# Patient Record
Sex: Female | Born: 1950
Health system: Southern US, Community
[De-identification: ages and names within clinical notes are randomized; demographics above are authoritative.]

## PROBLEM LIST (undated history)

## (undated) DIAGNOSIS — E785 Hyperlipidemia, unspecified: Secondary | ICD-10-CM

## (undated) DIAGNOSIS — E039 Hypothyroidism, unspecified: Secondary | ICD-10-CM

## (undated) DIAGNOSIS — M1712 Unilateral primary osteoarthritis, left knee: Secondary | ICD-10-CM

## (undated) DIAGNOSIS — I10 Essential (primary) hypertension: Secondary | ICD-10-CM

## (undated) DIAGNOSIS — D649 Anemia, unspecified: Secondary | ICD-10-CM

## (undated) DIAGNOSIS — R0600 Dyspnea, unspecified: Secondary | ICD-10-CM

## (undated) DIAGNOSIS — I499 Cardiac arrhythmia, unspecified: Secondary | ICD-10-CM

## (undated) DIAGNOSIS — R112 Nausea with vomiting, unspecified: Secondary | ICD-10-CM

## (undated) DIAGNOSIS — J45909 Unspecified asthma, uncomplicated: Secondary | ICD-10-CM

## (undated) DIAGNOSIS — Z87891 Personal history of nicotine dependence: Secondary | ICD-10-CM

## (undated) HISTORY — DX: Essential (primary) hypertension: I10

## (undated) HISTORY — DX: Hyperlipidemia, unspecified: E78.5

## (undated) HISTORY — DX: Hypothyroidism, unspecified: E03.9

## (undated) HISTORY — PX: OTHER SURGICAL HISTORY: SHX169

## (undated) HISTORY — PX: PARTIAL HYSTERECTOMY: SHX80

---

## 2009-09-11 ENCOUNTER — Ambulatory Visit: Payer: Self-pay | Admitting: General Practice

## 2009-09-16 ENCOUNTER — Ambulatory Visit: Payer: Self-pay | Admitting: General Practice

## 2012-08-01 ENCOUNTER — Ambulatory Visit: Payer: Self-pay | Admitting: General Practice

## 2012-08-01 LAB — CBC
HGB: 13.2 g/dL (ref 12.0–16.0)
MCH: 28.9 pg (ref 26.0–34.0)
MCHC: 34.3 g/dL (ref 32.0–36.0)
MCV: 84 fL (ref 80–100)
Platelet: 231 10*3/uL (ref 150–440)
RBC: 4.56 10*6/uL (ref 3.80–5.20)
RDW: 12.8 % (ref 11.5–14.5)
WBC: 7.7 10*3/uL (ref 3.6–11.0)

## 2012-08-01 LAB — BASIC METABOLIC PANEL
Anion Gap: 7 (ref 7–16)
BUN: 17 mg/dL (ref 7–18)
Calcium, Total: 9.6 mg/dL (ref 8.5–10.1)
Chloride: 107 mmol/L (ref 98–107)
EGFR (African American): >60
Osmolality: 285 (ref 275–301)

## 2012-08-01 LAB — URINALYSIS, COMPLETE
Bilirubin,UR: NEGATIVE
Glucose,UR: NEGATIVE mg/dL (ref 0–75)
Ph: 5 (ref 4.5–8.0)
Protein: NEGATIVE
RBC,UR: 1 /HPF (ref 0–5)
Squamous Epithelial: 3
WBC UR: 2 /HPF (ref 0–5)

## 2012-08-01 LAB — SEDIMENTATION RATE: Erythrocyte Sed Rate: 19 mm/hr (ref 0–30)

## 2012-08-01 LAB — MRSA PCR SCREENING

## 2012-08-17 ENCOUNTER — Inpatient Hospital Stay: Payer: Self-pay | Admitting: General Practice

## 2012-08-18 LAB — BASIC METABOLIC PANEL
Anion Gap: 7 (ref 7–16)
BUN: 9 mg/dL (ref 7–18)
Chloride: 108 mmol/L — ABNORMAL HIGH (ref 98–107)
Co2: 26 mmol/L (ref 21–32)
Creatinine: 0.73 mg/dL (ref 0.60–1.30)
EGFR (Non-African Amer.): >60
Glucose: 127 mg/dL — ABNORMAL HIGH (ref 65–99)
Osmolality: 282 (ref 275–301)
Potassium: 3.9 mmol/L (ref 3.5–5.1)

## 2012-08-18 LAB — PLATELET COUNT: Platelet: 194 10*3/uL (ref 150–440)

## 2012-08-18 LAB — HEMOGLOBIN: HGB: 10.7 g/dL — ABNORMAL LOW (ref 12.0–16.0)

## 2012-08-19 LAB — BASIC METABOLIC PANEL
Anion Gap: 5 — ABNORMAL LOW (ref 7–16)
BUN: 5 mg/dL — ABNORMAL LOW (ref 7–18)
Chloride: 106 mmol/L (ref 98–107)
Creatinine: 0.85 mg/dL (ref 0.60–1.30)
EGFR (Non-African Amer.): >60
Osmolality: 279 (ref 275–301)
Potassium: 3.3 mmol/L — ABNORMAL LOW (ref 3.5–5.1)
Sodium: 141 mmol/L (ref 136–145)

## 2012-08-19 LAB — HEMOGLOBIN: HGB: 10 g/dL — ABNORMAL LOW (ref 12.0–16.0)

## 2012-08-20 LAB — BASIC METABOLIC PANEL
Anion Gap: 4 — ABNORMAL LOW (ref 7–16)
BUN: 6 mg/dL — ABNORMAL LOW (ref 7–18)
Calcium, Total: 8.4 mg/dL — ABNORMAL LOW (ref 8.5–10.1)
Chloride: 106 mmol/L (ref 98–107)
EGFR (Non-African Amer.): >60
Osmolality: 280 (ref 275–301)
Potassium: 3.4 mmol/L — ABNORMAL LOW (ref 3.5–5.1)

## 2014-06-08 NOTE — Discharge Summary (Signed)
PATIENT NAME:  Jacqueline Herrera, Jacqueline Herrera MR#:  [id number] DATE OF BIRTH:  09-20-1950  DATE OF ADMISSION:  08/17/2012 DATE OF DISCHARGE:  08/20/2012   DICTATING FOR: Jeneen Rinks P. Holley Bouche., MD  ADMITTING DIAGNOSIS: Degenerative arthrosis of the right hip.   DISCHARGE DIAGNOSIS: Degenerative arthrosis of the right hip.   HISTORY: The patient is a pleasant 64 year old female who has been followed at [facility] for progression of right hip and groin pain. The pain was noted to be aggravated with weight-bearing activities. She has also been reporting some night pain. She states that she had had occasional radiation of her pain from the groin down to the level of the thigh in the last couple weeks prior to surgery as well as having some in the right buttocks radiating all the way down the leg to the ankle. She describes it as a pressure sensation. The patient had stated that nothing seemed to be giving her any relief from this discomfort. The patient had denied any knee swelling, locking or giving way. The patient had appreciated some decrease in her range of motion of the hip. At the time of surgery, she was not using any ambulatory aid. She had not seen any significant improvement in her condition despite intra-articular cortisone injections as well as mild activity modification. She has also been trying to take meloxicam, which did not really give her a lot of relief. The patient states that the pain increased to the point that it was significantly interfering with her activities of daily living. X-rays taken in [facility] showed bone-on-bone to the superior aspect of the femoral acetabulum. Subchondral sclerosis was noted. After discussion of the risks and benefits of surgical intervention, the patient expressed her understanding of the risks and benefits and agreed for plans for surgical intervention.   PROCEDURE: Right total hip arthroplasty.   ANESTHESIA: Spinal.   IMPLANTS UTILIZED: DePuy  10.5 mm small stature AML femoral component, a 48 mm outer diameter Pinnacle Gription Sector acetabular component, a +4 mm 10 degree Pinnacle Marathon polyethylene liner, and a 32 mm Cobalt chrome hip ball with a +1 mm neck length.   HOSPITAL COURSE: The patient tolerated the procedure very well. She had no complications. She was then taken to the PACU, where she was stabilized and then transferred to the orthopedic floor. The patient began receiving anticoagulation therapy of Lovenox 30 mg subcutaneous q.12 hours per anesthesia and pharmacy protocol. She was fitted with TED stockings bilaterally. These were allowed to be removed 1 hour per 8-hour shift. The patient was also fitted with the AVI compression foot pumps bilaterally set at 80 mmHg. Her calves have been nontender. There has been no evidence of any DVTs to the lower extremity. Negative Homans sign. Heels were elevated off the bed using rolled towels. Knees were flexed with pillows under the knees.   The patient has denied any chest pains or shortness of breath. Vital signs have been stable. She has been afebrile. Hemodynamically, she was stable. No transfusions were given. The patient was noted to be extremely nauseated the first couple days. This was felt to be secondary to the narcotics, and this was changed to Nucynta, which she seemed to tolerate very well. She actually had gone to taking just Tylenol on the last day of admission. Her potassium was slightly low, and this was supplemented with potassium, Klor-Con. This was felt to be secondary to the nausea that she was experiencing.   Physical therapy was initiated on day  1 for gait training and transfers. She has done very well. Upon being discharged, was ambulating greater than 200 feet. Was able to go up and down 4 sets of steps. Was independent with bed-to-chair transfers. Occupational therapy was also initiated on day 1 for ADLs and assistive devices. She has progressed very nicely. No  complications.   The patient's IV, Foley and Hemovac were discontinued on day 2 along with the dressing change. The wound was free of any drainage or any signs of infection. No neurologic deficit was noted to the lower extremity. Had good gross motor strength.   DISPOSITION: The patient is being discharged to home in improved stable condition.   DISCHARGE INSTRUCTIONS:  1. She may weight bear as tolerated. Continue using a walker until cleared by physical therapy to go to a quad cane.  2. She is to wear her TED stockings during the day, but may be removed at night.  3. She was instructed on posterior hip precautions once again.  4. She is to resume her regular diet.  5. Dressing will be changed as needed. Jodell Cipro will be removed on July 16th, followed by the application of benzoin and Steri-Strips.  6. She has a followup appointment on August 12th at 9:00 a.m. She is to call the clinic sooner if any bleeding or increased temperatures of 101.5 or greater.   DISCHARGE MEDICATIONS: She is to resume her regular medication that she was on prior to admission except for meloxicam. She may start this after finishing Lovenox. She was given a prescription for Nucynta 50 mg 1 to 2 tablets q.4-6 hours p.r.n. for pain and Lovenox 40 mg daily for 14 days, then discontinue and begin taking her 81 mg enteric-coated aspirin.   PAST MEDICAL HISTORY:  1. Seasonal allergies.  2. Thyroid disease.  3. Psoriasis.  4. Hypertension.  5. Hypercholesterolemia.   ____________________________ Vance Peper, PA [id number] D: 08/20/2012 07:42:46 ET T: 08/20/2012 08:29:08 ET JOB#: [id number]  cc: Vance Peper, PA, <Dictator> Brenna Friesenhahn PA ELECTRONICALLY SIGNED 08/21/2012 8:26

## 2014-06-08 NOTE — Op Note (Signed)
PATIENT NAME:  Jacqueline Herrera, Jacqueline Herrera MR#:  [id number] DATE OF BIRTH:  [birth year]  DATE OF PROCEDURE:  08/17/2012  PREOPERATIVE DIAGNOSIS: Degenerative arthrosis of the right hip.   POSTOPERATIVE DIAGNOSIS: Degenerative arthrosis of the right hip.   PROCEDURE PERFORMED: Right total hip arthroplasty.   SURGEON: Laurice Record. Holley Bouche., MD   ASSISTANT: Vance Peper, PA (required to maintain retraction throughout the procedure)   ANESTHESIA: Spinal.   ESTIMATED BLOOD LOSS: 300 mL.   FLUIDS REPLACED: 2000 mL of crystalloid.   DRAINS: Two medium drains to Hemovac reservoir.   IMPLANTS UTILIZED: DePuy 10.5 mm small stature AML femoral component, a 48 mm outer diameter Pinnacle Gription Sector acetabular component, a +4 mm, 10-degree Pinnacle Marathon polyethylene liner, and a 32 mm cobalt chrome hip ball with a +1 mm neck length.   INDICATIONS FOR SURGERY: The patient is a 64 year old female who has been seen for complaints of progressive right hip and groin pain. X-rays demonstrated significant degenerative changes. After discussion of the risks and benefits of surgical intervention, the patient expressed understanding of the risks and benefits and agreed with plans for surgical intervention.   PROCEDURE IN DETAIL: The patient was brought into the Operating Room, and after adequate spinal anesthesia was achieved, the patient was placed in a left lateral decubitus position. An axillary roll was placed, and all bony prominences were well padded. The patient's right hip and leg were cleaned and prepped with alcohol and DuraPrep and draped in the usual sterile fashion. A "timeout" was performed as per usual protocol. A lateral curvilinear incision was made gently curving towards the posterior superior iliac spine. IT band was incised in line with the skin incision, and the fibers of the gluteus maximus were split in line. The piriformis tendon was identified, skeletonized, and incised at its insertion at the  proximal femur and reflected posteriorly. In a similar fashion, short external rotators were incised and reflected posteriorly. A T-type posterior capsulotomy was performed. Prior to dislocation of the femoral head, a threaded Steinmann pin was inserted through a separate stab incision into the pelvis superior to the acetabulum and then bent in the form of a stylus so as to assess limb length and hip offset throughout the procedure. The femoral head was then dislocated posteriorly. Severe degenerative changes were noted most notably to the superior aspect. The femoral neck cut was performed using an oscillating saw. The anterior capsule was elevated off of the femoral neck. Inspection of the acetabulum also demonstrated significant degenerative change. Remnant of the labrum was excised. The acetabulum was reamed in a sequential fashion up to a 47 mm diameter. A good punctate bleeding bone was encountered. A 48 mm outer diameter Pinnacle Gription Sector acetabular component was positioned and impacted into place. Excellent scratch fit was appreciated. A +4 neutral polyethylene trial was inserted, and attention was directed to the proximal femur. A pilot hole for reaming of the proximal femoral canal was created using a high-speed bur. The proximal femoral canal was then reamed in a sequential fashion up to a 10 mm diameter. This allowed for more than 7 cm of scratch fit. The proximal portion of the femur was then prepared using a 10.5 mm aggressive side-biting reamer. A 10.5 mm broach was inserted and calcar region was planed. Trial reduction was performed using a 32 mm hip ball with a +1 mm neck segment. A good reproduction of hip offset was appreciated and good equalization of limb lengths was noted. Excellent stability was noted  anteriorly. Good stability was noted posteriorly. It was elected to trial with the +4 mm, 10-degree polyethylene trial with the high side at approximately 8 o'clock position. This allowed  for improved posterior stability. Trial components were removed. The acetabular shell was irrigated with copious amounts of normal saline with antibiotic solution and then suctioned dry. A +4 mm 10-degree Pinnacle Marathon polyethylene liner was positioned and impacted into place with the high side at 8 o'clock position.  Next, a 10.5 mm AML small stature femoral component was positioned. It was felt that an excessive amount of scratch fit would be achieved, and it was elected to ream line to line using a 10.5 mm reamer. The 10.5 mm small stature AML femoral component was positioned and impacted into place. Excellent scratch fit was appreciated. A 32 mm cobalt chrome hip ball with a +1 mm neck segment was placed on the trunnion and impacted into place. The hip was reduced and placed through range of motion. Good restoration of hip offset and equalization of limb lengths was noted. Excellent stability was noted both anteriorly and posteriorly.   The wound was irrigated with copious amounts of normal saline with antibiotic solution using pulsatile lavage and suctioned dry. Good hemostasis was appreciated. The T-type posterior capsulotomy was repaired using #5 Ethibond. The piriformis tendon was reapproximated on the undersurface of the gluteus medius tendon using #5 Ethibond. Two medium drains were placed in the wound bed and brought out through a separate stab incision. The gluteal sling was repaired using a #5 Ethibond. The IT band was repaired using interrupted sutures of #1 Vicryl. The subcutaneous tissue was approximated in layers using first #0 Vicryl, followed 2-0 Vicryl. The skin was closed with skin staples. A sterile dressing was applied.   The patient tolerated the procedure well. She was transported to the recovery room in stable condition.    ____________________________ Laurice Record. Holley Bouche., MD jph:cb D: 08/17/2012 15:46:28 ET T: 08/17/2012 20:07:45 ET JOB#: [id number]  cc: Laurice Record. Holley Bouche.,  MD, <Dictator> JAMES P Holley Bouche MD ELECTRONICALLY SIGNED 08/19/2012 18:00

## 2014-07-12 DIAGNOSIS — Z96641 Presence of right artificial hip joint: Secondary | ICD-10-CM | POA: Insufficient documentation

## 2014-12-11 ENCOUNTER — Other Ambulatory Visit: Payer: Self-pay

## 2014-12-11 DIAGNOSIS — Z1231 Encounter for screening mammogram for malignant neoplasm of breast: Secondary | ICD-10-CM

## 2015-02-19 ENCOUNTER — Ambulatory Visit
Admission: RE | Admit: 2015-02-19 | Discharge: 2015-02-19 | Disposition: A | Discharge status: Home / Self Care | Payer: PPO | Source: Ambulatory Visit

## 2015-02-19 DIAGNOSIS — Z1231 Encounter for screening mammogram for malignant neoplasm of breast: Secondary | ICD-10-CM

## 2015-06-03 DIAGNOSIS — I10 Essential (primary) hypertension: Secondary | ICD-10-CM | POA: Diagnosis not present

## 2015-06-03 DIAGNOSIS — E782 Mixed hyperlipidemia: Secondary | ICD-10-CM | POA: Diagnosis not present

## 2015-06-03 DIAGNOSIS — E669 Obesity, unspecified: Secondary | ICD-10-CM | POA: Diagnosis not present

## 2015-06-03 DIAGNOSIS — Z6836 Body mass index (BMI) 36.0-36.9, adult: Secondary | ICD-10-CM | POA: Diagnosis not present

## 2015-06-03 DIAGNOSIS — R7303 Prediabetes: Secondary | ICD-10-CM | POA: Diagnosis not present

## 2015-06-03 DIAGNOSIS — E039 Hypothyroidism, unspecified: Secondary | ICD-10-CM | POA: Diagnosis not present

## 2015-06-03 DIAGNOSIS — E559 Vitamin D deficiency, unspecified: Secondary | ICD-10-CM | POA: Diagnosis not present

## 2015-06-03 DIAGNOSIS — L918 Other hypertrophic disorders of the skin: Secondary | ICD-10-CM | POA: Diagnosis not present

## 2015-06-24 DIAGNOSIS — H2513 Age-related nuclear cataract, bilateral: Secondary | ICD-10-CM | POA: Diagnosis not present

## 2015-06-24 DIAGNOSIS — H401130 Primary open-angle glaucoma, bilateral, stage unspecified: Secondary | ICD-10-CM | POA: Diagnosis not present

## 2015-12-19 DIAGNOSIS — Z1389 Encounter for screening for other disorder: Secondary | ICD-10-CM | POA: Diagnosis not present

## 2015-12-19 DIAGNOSIS — E782 Mixed hyperlipidemia: Secondary | ICD-10-CM | POA: Diagnosis not present

## 2015-12-19 DIAGNOSIS — E039 Hypothyroidism, unspecified: Secondary | ICD-10-CM | POA: Diagnosis not present

## 2015-12-19 DIAGNOSIS — Z9181 History of falling: Secondary | ICD-10-CM | POA: Diagnosis not present

## 2015-12-19 DIAGNOSIS — I10 Essential (primary) hypertension: Secondary | ICD-10-CM | POA: Diagnosis not present

## 2016-01-15 ENCOUNTER — Other Ambulatory Visit: Payer: Self-pay | Admitting: Family Medicine

## 2016-01-15 DIAGNOSIS — Z1231 Encounter for screening mammogram for malignant neoplasm of breast: Secondary | ICD-10-CM

## 2016-02-28 ENCOUNTER — Ambulatory Visit
Admission: RE | Admit: 2016-02-28 | Discharge: 2016-02-28 | Disposition: A | Discharge status: Home / Self Care | Payer: PPO | Source: Ambulatory Visit | Attending: Family Medicine | Admitting: Family Medicine

## 2016-02-28 DIAGNOSIS — Z1231 Encounter for screening mammogram for malignant neoplasm of breast: Secondary | ICD-10-CM

## 2016-06-22 DIAGNOSIS — I10 Essential (primary) hypertension: Secondary | ICD-10-CM | POA: Diagnosis not present

## 2016-06-22 DIAGNOSIS — E559 Vitamin D deficiency, unspecified: Secondary | ICD-10-CM | POA: Diagnosis not present

## 2016-06-22 DIAGNOSIS — E669 Obesity, unspecified: Secondary | ICD-10-CM | POA: Diagnosis not present

## 2016-06-22 DIAGNOSIS — E782 Mixed hyperlipidemia: Secondary | ICD-10-CM | POA: Diagnosis not present

## 2016-06-22 DIAGNOSIS — Z6835 Body mass index (BMI) 35.0-35.9, adult: Secondary | ICD-10-CM | POA: Diagnosis not present

## 2016-06-22 DIAGNOSIS — E039 Hypothyroidism, unspecified: Secondary | ICD-10-CM | POA: Diagnosis not present

## 2016-06-23 DIAGNOSIS — H2513 Age-related nuclear cataract, bilateral: Secondary | ICD-10-CM | POA: Diagnosis not present

## 2016-06-23 DIAGNOSIS — H401130 Primary open-angle glaucoma, bilateral, stage unspecified: Secondary | ICD-10-CM | POA: Diagnosis not present

## 2016-09-23 DIAGNOSIS — Z6836 Body mass index (BMI) 36.0-36.9, adult: Secondary | ICD-10-CM | POA: Diagnosis not present

## 2016-09-23 DIAGNOSIS — N959 Unspecified menopausal and perimenopausal disorder: Secondary | ICD-10-CM | POA: Diagnosis not present

## 2016-09-23 DIAGNOSIS — Z9181 History of falling: Secondary | ICD-10-CM | POA: Diagnosis not present

## 2016-09-23 DIAGNOSIS — Z1389 Encounter for screening for other disorder: Secondary | ICD-10-CM | POA: Diagnosis not present

## 2016-09-23 DIAGNOSIS — Z136 Encounter for screening for cardiovascular disorders: Secondary | ICD-10-CM | POA: Diagnosis not present

## 2016-09-23 DIAGNOSIS — E785 Hyperlipidemia, unspecified: Secondary | ICD-10-CM | POA: Diagnosis not present

## 2016-09-23 DIAGNOSIS — Z0000 Encounter for general adult medical examination without abnormal findings: Secondary | ICD-10-CM | POA: Diagnosis not present

## 2016-10-08 DIAGNOSIS — N959 Unspecified menopausal and perimenopausal disorder: Secondary | ICD-10-CM | POA: Diagnosis not present

## 2016-10-08 DIAGNOSIS — Z13820 Encounter for screening for osteoporosis: Secondary | ICD-10-CM | POA: Diagnosis not present

## 2016-12-30 DIAGNOSIS — Z23 Encounter for immunization: Secondary | ICD-10-CM | POA: Diagnosis not present

## 2016-12-30 DIAGNOSIS — I10 Essential (primary) hypertension: Secondary | ICD-10-CM | POA: Diagnosis not present

## 2016-12-30 DIAGNOSIS — E559 Vitamin D deficiency, unspecified: Secondary | ICD-10-CM | POA: Diagnosis not present

## 2016-12-30 DIAGNOSIS — E669 Obesity, unspecified: Secondary | ICD-10-CM | POA: Diagnosis not present

## 2016-12-30 DIAGNOSIS — Z6836 Body mass index (BMI) 36.0-36.9, adult: Secondary | ICD-10-CM | POA: Diagnosis not present

## 2016-12-30 DIAGNOSIS — E782 Mixed hyperlipidemia: Secondary | ICD-10-CM | POA: Diagnosis not present

## 2016-12-30 DIAGNOSIS — E039 Hypothyroidism, unspecified: Secondary | ICD-10-CM | POA: Diagnosis not present

## 2017-01-18 ENCOUNTER — Other Ambulatory Visit: Payer: Self-pay | Admitting: Nurse Practitioner

## 2017-01-18 DIAGNOSIS — Z1231 Encounter for screening mammogram for malignant neoplasm of breast: Secondary | ICD-10-CM

## 2017-02-10 DIAGNOSIS — M545 Low back pain: Secondary | ICD-10-CM | POA: Diagnosis not present

## 2017-02-10 DIAGNOSIS — S335XXA Sprain of ligaments of lumbar spine, initial encounter: Secondary | ICD-10-CM | POA: Diagnosis not present

## 2017-03-01 ENCOUNTER — Ambulatory Visit
Admission: RE | Admit: 2017-03-01 | Discharge: 2017-03-01 | Disposition: A | Discharge status: Home / Self Care | Payer: PPO | Source: Ambulatory Visit | Attending: Nurse Practitioner | Admitting: Nurse Practitioner

## 2017-03-01 DIAGNOSIS — Z1231 Encounter for screening mammogram for malignant neoplasm of breast: Secondary | ICD-10-CM | POA: Diagnosis not present

## 2017-03-15 DIAGNOSIS — J0100 Acute maxillary sinusitis, unspecified: Secondary | ICD-10-CM | POA: Diagnosis not present

## 2017-03-28 DIAGNOSIS — H6990 Unspecified Eustachian tube disorder, unspecified ear: Secondary | ICD-10-CM | POA: Diagnosis not present

## 2017-04-08 DIAGNOSIS — H90A22 Sensorineural hearing loss, unilateral, left ear, with restricted hearing on the contralateral side: Secondary | ICD-10-CM | POA: Diagnosis not present

## 2017-05-04 DIAGNOSIS — M654 Radial styloid tenosynovitis [de Quervain]: Secondary | ICD-10-CM | POA: Diagnosis not present

## 2017-05-04 DIAGNOSIS — Z6836 Body mass index (BMI) 36.0-36.9, adult: Secondary | ICD-10-CM | POA: Diagnosis not present

## 2017-05-04 DIAGNOSIS — Z1331 Encounter for screening for depression: Secondary | ICD-10-CM | POA: Diagnosis not present

## 2017-05-04 DIAGNOSIS — L989 Disorder of the skin and subcutaneous tissue, unspecified: Secondary | ICD-10-CM | POA: Diagnosis not present

## 2017-05-04 DIAGNOSIS — E782 Mixed hyperlipidemia: Secondary | ICD-10-CM | POA: Diagnosis not present

## 2017-05-04 DIAGNOSIS — L980 Pyogenic granuloma: Secondary | ICD-10-CM | POA: Diagnosis not present

## 2017-06-23 DIAGNOSIS — H2513 Age-related nuclear cataract, bilateral: Secondary | ICD-10-CM | POA: Diagnosis not present

## 2017-06-23 DIAGNOSIS — H401131 Primary open-angle glaucoma, bilateral, mild stage: Secondary | ICD-10-CM | POA: Diagnosis not present

## 2017-07-20 DIAGNOSIS — I10 Essential (primary) hypertension: Secondary | ICD-10-CM | POA: Diagnosis not present

## 2017-07-20 DIAGNOSIS — Z6836 Body mass index (BMI) 36.0-36.9, adult: Secondary | ICD-10-CM | POA: Diagnosis not present

## 2017-07-20 DIAGNOSIS — E782 Mixed hyperlipidemia: Secondary | ICD-10-CM | POA: Diagnosis not present

## 2017-07-20 DIAGNOSIS — E039 Hypothyroidism, unspecified: Secondary | ICD-10-CM | POA: Diagnosis not present

## 2017-07-20 DIAGNOSIS — R7303 Prediabetes: Secondary | ICD-10-CM | POA: Diagnosis not present

## 2017-07-20 DIAGNOSIS — M654 Radial styloid tenosynovitis [de Quervain]: Secondary | ICD-10-CM | POA: Diagnosis not present

## 2017-08-06 DIAGNOSIS — H25013 Cortical age-related cataract, bilateral: Secondary | ICD-10-CM | POA: Diagnosis not present

## 2017-08-06 DIAGNOSIS — H2511 Age-related nuclear cataract, right eye: Secondary | ICD-10-CM | POA: Diagnosis not present

## 2017-08-06 DIAGNOSIS — H4321 Crystalline deposits in vitreous body, right eye: Secondary | ICD-10-CM | POA: Diagnosis not present

## 2017-08-06 DIAGNOSIS — H2513 Age-related nuclear cataract, bilateral: Secondary | ICD-10-CM | POA: Diagnosis not present

## 2017-08-06 DIAGNOSIS — H40013 Open angle with borderline findings, low risk, bilateral: Secondary | ICD-10-CM | POA: Diagnosis not present

## 2017-08-17 DIAGNOSIS — H2511 Age-related nuclear cataract, right eye: Secondary | ICD-10-CM | POA: Diagnosis not present

## 2017-08-17 DIAGNOSIS — H25811 Combined forms of age-related cataract, right eye: Secondary | ICD-10-CM | POA: Diagnosis not present

## 2017-08-23 DIAGNOSIS — H2511 Age-related nuclear cataract, right eye: Secondary | ICD-10-CM | POA: Diagnosis not present

## 2017-08-30 DIAGNOSIS — H2512 Age-related nuclear cataract, left eye: Secondary | ICD-10-CM | POA: Diagnosis not present

## 2017-08-30 DIAGNOSIS — H25012 Cortical age-related cataract, left eye: Secondary | ICD-10-CM | POA: Diagnosis not present

## 2017-09-07 DIAGNOSIS — H2512 Age-related nuclear cataract, left eye: Secondary | ICD-10-CM | POA: Diagnosis not present

## 2017-09-07 DIAGNOSIS — H25812 Combined forms of age-related cataract, left eye: Secondary | ICD-10-CM | POA: Diagnosis not present

## 2017-09-15 DIAGNOSIS — H2511 Age-related nuclear cataract, right eye: Secondary | ICD-10-CM | POA: Diagnosis not present

## 2017-12-28 DIAGNOSIS — H401131 Primary open-angle glaucoma, bilateral, mild stage: Secondary | ICD-10-CM | POA: Diagnosis not present

## 2018-01-24 ENCOUNTER — Other Ambulatory Visit: Payer: Self-pay | Admitting: Family Medicine

## 2018-01-24 DIAGNOSIS — Z1231 Encounter for screening mammogram for malignant neoplasm of breast: Secondary | ICD-10-CM

## 2018-01-25 DIAGNOSIS — I10 Essential (primary) hypertension: Secondary | ICD-10-CM | POA: Diagnosis not present

## 2018-01-25 DIAGNOSIS — E782 Mixed hyperlipidemia: Secondary | ICD-10-CM | POA: Diagnosis not present

## 2018-01-25 DIAGNOSIS — E039 Hypothyroidism, unspecified: Secondary | ICD-10-CM | POA: Diagnosis not present

## 2018-01-25 DIAGNOSIS — R7303 Prediabetes: Secondary | ICD-10-CM | POA: Diagnosis not present

## 2018-01-25 DIAGNOSIS — Z139 Encounter for screening, unspecified: Secondary | ICD-10-CM | POA: Diagnosis not present

## 2018-01-25 DIAGNOSIS — Z6836 Body mass index (BMI) 36.0-36.9, adult: Secondary | ICD-10-CM | POA: Diagnosis not present

## 2018-01-25 DIAGNOSIS — Z9181 History of falling: Secondary | ICD-10-CM | POA: Diagnosis not present

## 2018-03-02 ENCOUNTER — Ambulatory Visit
Admission: RE | Admit: 2018-03-02 | Discharge: 2018-03-02 | Disposition: A | Discharge status: Home / Self Care | Payer: PPO | Source: Ambulatory Visit | Attending: Family Medicine | Admitting: Family Medicine

## 2018-03-02 DIAGNOSIS — Z1231 Encounter for screening mammogram for malignant neoplasm of breast: Secondary | ICD-10-CM | POA: Diagnosis not present

## 2018-04-28 DIAGNOSIS — E782 Mixed hyperlipidemia: Secondary | ICD-10-CM | POA: Diagnosis not present

## 2018-04-28 DIAGNOSIS — I10 Essential (primary) hypertension: Secondary | ICD-10-CM | POA: Diagnosis not present

## 2018-04-28 DIAGNOSIS — Z6836 Body mass index (BMI) 36.0-36.9, adult: Secondary | ICD-10-CM | POA: Diagnosis not present

## 2018-08-11 DIAGNOSIS — Z6836 Body mass index (BMI) 36.0-36.9, adult: Secondary | ICD-10-CM | POA: Diagnosis not present

## 2018-08-11 DIAGNOSIS — R252 Cramp and spasm: Secondary | ICD-10-CM | POA: Diagnosis not present

## 2018-08-11 DIAGNOSIS — E782 Mixed hyperlipidemia: Secondary | ICD-10-CM | POA: Diagnosis not present

## 2018-08-11 DIAGNOSIS — I10 Essential (primary) hypertension: Secondary | ICD-10-CM | POA: Diagnosis not present

## 2018-08-11 DIAGNOSIS — E039 Hypothyroidism, unspecified: Secondary | ICD-10-CM | POA: Diagnosis not present

## 2018-08-11 DIAGNOSIS — Z1331 Encounter for screening for depression: Secondary | ICD-10-CM | POA: Diagnosis not present

## 2018-08-11 DIAGNOSIS — R7303 Prediabetes: Secondary | ICD-10-CM | POA: Diagnosis not present

## 2018-11-29 DIAGNOSIS — L821 Other seborrheic keratosis: Secondary | ICD-10-CM | POA: Diagnosis not present

## 2018-11-29 DIAGNOSIS — Z6836 Body mass index (BMI) 36.0-36.9, adult: Secondary | ICD-10-CM | POA: Diagnosis not present

## 2018-12-06 DIAGNOSIS — L608 Other nail disorders: Secondary | ICD-10-CM | POA: Diagnosis not present

## 2018-12-06 DIAGNOSIS — Z6836 Body mass index (BMI) 36.0-36.9, adult: Secondary | ICD-10-CM | POA: Diagnosis not present

## 2018-12-28 DIAGNOSIS — H401131 Primary open-angle glaucoma, bilateral, mild stage: Secondary | ICD-10-CM | POA: Diagnosis not present

## 2018-12-28 DIAGNOSIS — H26493 Other secondary cataract, bilateral: Secondary | ICD-10-CM | POA: Diagnosis not present

## 2018-12-28 DIAGNOSIS — H1045 Other chronic allergic conjunctivitis: Secondary | ICD-10-CM | POA: Diagnosis not present

## 2019-01-03 DIAGNOSIS — L400 Psoriasis vulgaris: Secondary | ICD-10-CM | POA: Diagnosis not present

## 2019-01-03 DIAGNOSIS — L609 Nail disorder, unspecified: Secondary | ICD-10-CM | POA: Diagnosis not present

## 2019-01-23 ENCOUNTER — Other Ambulatory Visit: Payer: Self-pay | Admitting: Nurse Practitioner

## 2019-01-23 ENCOUNTER — Other Ambulatory Visit: Payer: Self-pay | Admitting: Family Medicine

## 2019-01-23 DIAGNOSIS — Z1231 Encounter for screening mammogram for malignant neoplasm of breast: Secondary | ICD-10-CM

## 2019-02-13 DIAGNOSIS — R252 Cramp and spasm: Secondary | ICD-10-CM | POA: Diagnosis not present

## 2019-02-13 DIAGNOSIS — R7303 Prediabetes: Secondary | ICD-10-CM | POA: Diagnosis not present

## 2019-02-13 DIAGNOSIS — Z2821 Immunization not carried out because of patient refusal: Secondary | ICD-10-CM | POA: Diagnosis not present

## 2019-02-13 DIAGNOSIS — Z6837 Body mass index (BMI) 37.0-37.9, adult: Secondary | ICD-10-CM | POA: Diagnosis not present

## 2019-02-13 DIAGNOSIS — E669 Obesity, unspecified: Secondary | ICD-10-CM | POA: Diagnosis not present

## 2019-02-13 DIAGNOSIS — I10 Essential (primary) hypertension: Secondary | ICD-10-CM | POA: Diagnosis not present

## 2019-02-13 DIAGNOSIS — E039 Hypothyroidism, unspecified: Secondary | ICD-10-CM | POA: Diagnosis not present

## 2019-02-13 DIAGNOSIS — E782 Mixed hyperlipidemia: Secondary | ICD-10-CM | POA: Diagnosis not present

## 2019-02-13 DIAGNOSIS — Z9181 History of falling: Secondary | ICD-10-CM | POA: Diagnosis not present

## 2019-03-09 ENCOUNTER — Ambulatory Visit: Payer: PPO

## 2019-03-24 DIAGNOSIS — Z6837 Body mass index (BMI) 37.0-37.9, adult: Secondary | ICD-10-CM | POA: Diagnosis not present

## 2019-03-24 DIAGNOSIS — R319 Hematuria, unspecified: Secondary | ICD-10-CM | POA: Diagnosis not present

## 2019-04-25 ENCOUNTER — Other Ambulatory Visit: Payer: Self-pay

## 2019-04-25 ENCOUNTER — Ambulatory Visit
Admission: RE | Admit: 2019-04-25 | Discharge: 2019-04-25 | Disposition: A | Discharge status: Home / Self Care | Payer: PPO | Source: Ambulatory Visit | Attending: Nurse Practitioner | Admitting: Nurse Practitioner

## 2019-04-25 DIAGNOSIS — Z1231 Encounter for screening mammogram for malignant neoplasm of breast: Secondary | ICD-10-CM

## 2019-05-02 NOTE — Progress Notes (Signed)
05/03/19 7:20 PM   Jacqueline Herrera 02-23-1950 FY:[id number]  Referring provider: Philmore Pali, NP [address] [place],  [place] [address]  Chief Complaint  Patient presents with  . Hematuria    HPI: Jacqueline Herrera is a 69 yo white F who presents today for the evaluation and management of hematuria. She was referred to [place] by Daiva Eves, MD.   Today, she reports of a handful of blood after wiping with toilet tissue on 03/23/19. The following morning she reports of blood on her towel after taking a shower. She doesn't know if it's from her vagina. She is asymptomatic currently.   She visited her PCP on 03/24/19 where she complained of gross hematuria the night before PCP visit. She had UA done which indicated trace RBC and nitrite negative.   She had a remote hx of hematuria in the past and an associated negative hematuria workup.  She takes baby aspirin.   Former smoker, 1 ppd since 2007 high risk category.   She denies past hx of hematuria, flank pain, and kidney stones. She has no FHx of kidney cancer or bladder cancer.   She has a SHx of cervicectomy and hysterectomy.   Does occasionally have some blood per vagina associated with yeast infections but was not symptomatic of this at the time of the aforementioned episode.  The degree of blood was also significantly different.  PMH: Past Medical History:  Diagnosis Date  . Hyperlipidemia   . Hypertension   . Hypothyroid     Surgical History: Past Surgical History:  Procedure Laterality Date  . PARTIAL HYSTERECTOMY    . right hip replacement      Home Medications:  Allergies as of 05/03/2019      Reactions   Codeine Nausea And Vomiting   Tramadol Nausea And Vomiting   Nausea.      Medication List       Accurate as of May 03, 2019  7:20 PM. If you have any questions, ask your nurse or doctor.        aspirin 81 MG EC tablet Take by mouth.   clobetasol cream 0.05 % Commonly known as: TEMOVATE APPLY A SMALL  AMOUNT TO FEET AS DIRECTED 1 2 TIMES DAILY AS NEEDED AVOID F/G/A   Co Q 10 100 MG Caps   enalapril 5 MG tablet Commonly known as: VASOTEC Take 5 mg by mouth daily.   ergocalciferol 1.25 MG (50000 UT) capsule Commonly known as: VITAMIN D2 Take by mouth.   Fish Oil 1200 MG Caps Take by mouth.   hydrochlorothiazide 25 MG tablet Commonly known as: HYDRODIURIL Take by mouth.   Jublia 10 % Soln Generic drug: Efinaconazole BRUSH ON TO THE AFFECTED NAIL AS DIRECTED EVERY DAY AT BEDTIME   levothyroxine 112 MCG tablet Commonly known as: SYNTHROID Take by mouth.   lovastatin 20 MG tablet Commonly known as: MEVACOR Take by mouth.   Resource 2.0 Liqd Take by mouth.   rosuvastatin 10 MG tablet Commonly known as: CRESTOR Take 10 mg by mouth at bedtime.   Vitamin D3 50 MCG (2000 UT) Chew       Allergies:  Allergies  Allergen Reactions  . Codeine Nausea And Vomiting  . Tramadol Nausea And Vomiting    Nausea.     Family History: Family History  Problem Relation Age of Onset  . Breast cancer Sister 50    Social History:  has no history on file for tobacco, alcohol, and  drug.   Physical Exam: BP (!) 168/83   Pulse 97   Ht 5\' 3"  (1.6 m)   Wt 195 lb (88.5 kg)   BMI 34.54 kg/m   Constitutional:  Alert and oriented, No acute distress. HEENT: Tool AT, moist mucus membranes.  Trachea midline, no masses. Cardiovascular: No clubbing, cyanosis, or edema. Respiratory: Normal respiratory effort, no increased work of breathing. Skin: No rashes, bruises or suspicious lesions. Neurologic: Grossly intact, no focal deficits, moving all 4 extremities. Psychiatric: Normal mood and affect.  Laboratory Data: n/a  Urinalysis UA 6-10 WBC, no RBC, nitrite negative.  Assessment & Plan:    1. Gross Hematuria  Based on the nature of her bleeding, it still unclear whether or not this is related to her bladder and/or urethra however given her history of hysterectomy, is less likely  vaginal.  Plan to treat this like gross hematuria and evaluate as below.  We discussed the differential diagnosis for hematuria hematuria including nephrolithiasis, renal or upper tract tumors, bladder stones, UTIs, or bladder tumors as well as undetermined etiologies. Per AUA guidelines, I did recommend complete microscopic hematuria evaluation including CTU, possible urine cytology, and office cystoscopy.  She's in a high risk category given she's a female, >65, and history of smoking    Return in about 4 weeks (around 05/31/2019) for Cysto/ CT urogram.  [facility] [address], [place], [place] [address] [phone]  I, Lucas Mallow, am acting as a scribe for Dr. Hollice Espy,  I have reviewed the above documentation for accuracy and completeness, and I agree with the above.   Hollice Espy, MD

## 2019-05-03 ENCOUNTER — Encounter: Payer: Self-pay | Admitting: Urology

## 2019-05-03 ENCOUNTER — Other Ambulatory Visit: Payer: Self-pay

## 2019-05-03 ENCOUNTER — Ambulatory Visit: Payer: PPO | Admitting: Urology

## 2019-05-03 VITALS — BP 168/83 | HR 97 | Ht 63.0 in | Wt 195.0 lb

## 2019-05-03 DIAGNOSIS — R319 Hematuria, unspecified: Secondary | ICD-10-CM | POA: Diagnosis not present

## 2019-05-03 NOTE — Patient Instructions (Signed)

## 2019-05-04 LAB — URINALYSIS, COMPLETE
Bilirubin, UA: NEGATIVE
Glucose, UA: NEGATIVE
Ketones, UA: NEGATIVE
Nitrite, UA: NEGATIVE
Protein,UA: NEGATIVE
Specific Gravity, UA: 1.01 (ref 1.005–1.030)
Urobilinogen, Ur: 0.2 mg/dL (ref 0.2–1.0)
pH, UA: 5 (ref 5.0–7.5)

## 2019-05-04 LAB — MICROSCOPIC EXAMINATION: Bacteria, UA: NONE SEEN

## 2019-06-02 ENCOUNTER — Ambulatory Visit
Admission: RE | Admit: 2019-06-02 | Discharge: 2019-06-02 | Disposition: A | Discharge status: Home / Self Care | Payer: PPO | Source: Ambulatory Visit | Attending: Urology | Admitting: Urology

## 2019-06-02 ENCOUNTER — Other Ambulatory Visit: Payer: Self-pay

## 2019-06-02 DIAGNOSIS — R310 Gross hematuria: Secondary | ICD-10-CM | POA: Diagnosis not present

## 2019-06-02 DIAGNOSIS — R319 Hematuria, unspecified: Secondary | ICD-10-CM | POA: Diagnosis not present

## 2019-06-02 LAB — POCT I-STAT CREATININE: Creatinine, Ser: 0.8 mg/dL (ref 0.44–1.00)

## 2019-06-02 MED ORDER — IOHEXOL 300 MG/ML  SOLN
125.0000 mL | Freq: Once | INTRAMUSCULAR | Status: AC | PRN
  Administered 2019-06-02: 125 mL via INTRAVENOUS

## 2019-06-06 NOTE — Progress Notes (Signed)
06/07/19  CC:  Chief Complaint  Patient presents with  . Hematuria    HPI: Jacqueline Herrera is a 69 y.o. F who returns today for a cysto for the evaluation and management of hematuria.   On 05/03/19, she reported of a handful of blood after wiping with toilet tissue on 03/23/19. The following morning she reports of blood on her towel after taking a shower. She doesn't know if it's from her vagina. She was asymptomatic at time of visit.   She visited her PCP on 03/24/19 where she complained of gross hematuria the night before PCP visit. She had UA done which indicated trace RBC and nitrite negative.   She had a remote hx of hematuria in the past and an associated negative hematuria workup.  CT from 06/02/19 unremarkable for cause of hematuria however small right lower lobe subpleural nodule along the major fissure noted.   Denies recent episodes of gross hematuria.   Former smoker, 1 ppd since 2007 high risk category.   She has a SHx of cervicectomy and hysterectomy.   Does occasionally have some blood per vagina associated with yeast infections but was not symptomatic of this at the time of the aforementioned episode.  The degree of blood was also significantly different.  Blood pressure (!) 163/82, pulse 90, height 5\' 3"  (1.6 m), weight 195 lb (88.5 kg).. NED. A&Ox3.   No respiratory distress   Abd soft, NT, ND Normal external genitalia with patent urethral meatus  Cystoscopy Procedure Note  Patient identification was confirmed, informed consent was obtained, and patient was prepped using Betadine solution.  Lidocaine jelly was administered per urethral meatus.    Procedure: - Flexible cystoscope introduced, without any difficulty.   - Thorough search of the bladder revealed:    normal urethral meatus    normal urothelium    no stones    no ulcers     no tumors    no urethral polyps    no trabeculation  - Ureteral orifices were normal in position and  appearance.  Post-Procedure: - Patient tolerated the procedure well  Pertinent Imagings:  CLINICAL DATA:  Painless gross hematuria starting 2 months ago  EXAM: CT ABDOMEN AND PELVIS WITHOUT AND WITH CONTRAST  TECHNIQUE: Multidetector CT imaging of the abdomen and pelvis was performed following the standard protocol before and following the bolus administration of intravenous contrast.  CONTRAST:  136mL OMNIPAQUE IOHEXOL 300 MG/ML  SOLN  COMPARISON:  None.  FINDINGS: Lower chest: 9 by 4 by 4 mm (volume = 80 mm^3) right lower lobe subpleural nodule along the major fissure on image 3/20, probably a subpleural lymph node although technically nonspecific.  Hepatobiliary: Unremarkable  Pancreas: Unremarkable  Spleen: 1.0 by 0.9 cm hypodense lesion of the spleen posteriorly, technically nonspecific although highly likely to be a benign lesion such as a splenic cyst or lymphangioma.  Adrenals/Urinary Tract: Both adrenal glands appear normal. 5 by 4 mm hypodense lesion of the left mid kidney no urinary tract calculi are identified. No abnormal renal parenchymal enhancement characteristics. No abnormal filling defect or abnormal enhancement along the urothelium. Sensitivity slightly reduced in assessing the urinary bladder due to streak artifact from the patient's right hip implant on the delayed images, and metal artifact reduction algorithms performed on the precontrast and portal venous phase images.  Stomach/Bowel: There several scattered sigmoid colon diverticula. Otherwise unremarkable.  Vascular/Lymphatic: Aortoiliac atherosclerotic vascular disease. No adenopathy.  Reproductive: Uterus absent. Adnexa unremarkable.  Other: No supplemental non-categorized findings.  Musculoskeletal: Right total hip prosthesis. Degenerative disc disease with loss of disc height at L5-S1.  IMPRESSION: 1. A cause for hematuria is not identified. 2. Small right lower  lobe subpleural nodule along the major fissure, likely a subpleural lymph node although technically nonspecific. No follow-up needed if patient is low-risk. Non-contrast chest CT can be considered in 12 months if patient is high-risk. This recommendation follows the consensus statement: Guidelines for Management of Incidental Pulmonary Nodules Detected on CT Images: From the Fleischner Society 2017; Radiology 2017; 284:228-243. 3. Other imaging findings of potential clinical significance: Small hypodense lesion of the spleen posteriorly, likely a benign lesion such as a splenic cyst or lymphangioma. Degenerative disc disease with loss of disc height at L5-S1.  Aortic Atherosclerosis (ICD10-I70.0).   Electronically Signed   By: Van Clines M.D.   On: 06/02/2019 14:06  I have personally reviewed the images and agree with radiologist interpretation.   Assessment/ Plan:  1. Gross hematuria  CT from 06/02/19 unremarkable  NED on cysto  Return in 1 year w/ PA-C for UA   2. Pulmonary nodule  Recent CT indicated small right lower lobe subpleural nodule along the major fissure Ordered f/u Chest CT given hx of smoking    Return in about 1 year (around 06/06/2020) for Recheck with PA-UA .  [name], am acting as a scribe for Dr. Hollice Espy,  I have reviewed the above documentation for accuracy and completeness, and I agree with the above.   Hollice Espy, MD

## 2019-06-07 ENCOUNTER — Other Ambulatory Visit: Payer: Self-pay

## 2019-06-07 ENCOUNTER — Ambulatory Visit: Payer: PPO | Admitting: Urology

## 2019-06-07 ENCOUNTER — Encounter: Payer: Self-pay | Admitting: Urology

## 2019-06-07 VITALS — BP 163/82 | HR 90 | Ht 63.0 in | Wt 195.0 lb

## 2019-06-07 DIAGNOSIS — R918 Other nonspecific abnormal finding of lung field: Secondary | ICD-10-CM

## 2019-06-07 DIAGNOSIS — R319 Hematuria, unspecified: Secondary | ICD-10-CM | POA: Diagnosis not present

## 2019-06-07 DIAGNOSIS — R911 Solitary pulmonary nodule: Secondary | ICD-10-CM

## 2019-06-07 DIAGNOSIS — Z9189 Other specified personal risk factors, not elsewhere classified: Secondary | ICD-10-CM

## 2019-06-08 LAB — URINALYSIS, COMPLETE
Bilirubin, UA: NEGATIVE
Glucose, UA: NEGATIVE
Ketones, UA: NEGATIVE
Nitrite, UA: NEGATIVE
Protein,UA: NEGATIVE
Specific Gravity, UA: 1.015 (ref 1.005–1.030)
Urobilinogen, Ur: 0.2 mg/dL (ref 0.2–1.0)
pH, UA: 5.5 (ref 5.0–7.5)

## 2019-06-08 LAB — MICROSCOPIC EXAMINATION: Epithelial Cells (non renal): >10 /hpf — AB (ref 0–10)

## 2019-08-14 DIAGNOSIS — I10 Essential (primary) hypertension: Secondary | ICD-10-CM | POA: Diagnosis not present

## 2019-08-14 DIAGNOSIS — R319 Hematuria, unspecified: Secondary | ICD-10-CM | POA: Diagnosis not present

## 2019-08-14 DIAGNOSIS — E039 Hypothyroidism, unspecified: Secondary | ICD-10-CM | POA: Diagnosis not present

## 2019-08-14 DIAGNOSIS — G720 Drug-induced myopathy: Secondary | ICD-10-CM | POA: Diagnosis not present

## 2019-08-14 DIAGNOSIS — E669 Obesity, unspecified: Secondary | ICD-10-CM | POA: Diagnosis not present

## 2019-08-14 DIAGNOSIS — I700 Atherosclerosis of aorta: Secondary | ICD-10-CM | POA: Diagnosis not present

## 2019-08-14 DIAGNOSIS — Z139 Encounter for screening, unspecified: Secondary | ICD-10-CM | POA: Diagnosis not present

## 2019-08-14 DIAGNOSIS — E559 Vitamin D deficiency, unspecified: Secondary | ICD-10-CM | POA: Diagnosis not present

## 2019-08-14 DIAGNOSIS — R911 Solitary pulmonary nodule: Secondary | ICD-10-CM | POA: Diagnosis not present

## 2019-08-14 DIAGNOSIS — E782 Mixed hyperlipidemia: Secondary | ICD-10-CM | POA: Diagnosis not present

## 2019-08-14 DIAGNOSIS — Z6836 Body mass index (BMI) 36.0-36.9, adult: Secondary | ICD-10-CM | POA: Diagnosis not present

## 2019-08-14 DIAGNOSIS — R7303 Prediabetes: Secondary | ICD-10-CM | POA: Diagnosis not present

## 2019-09-26 DIAGNOSIS — E039 Hypothyroidism, unspecified: Secondary | ICD-10-CM | POA: Diagnosis not present

## 2020-01-03 DIAGNOSIS — H1045 Other chronic allergic conjunctivitis: Secondary | ICD-10-CM | POA: Diagnosis not present

## 2020-01-03 DIAGNOSIS — H40013 Open angle with borderline findings, low risk, bilateral: Secondary | ICD-10-CM | POA: Diagnosis not present

## 2020-01-03 DIAGNOSIS — H26493 Other secondary cataract, bilateral: Secondary | ICD-10-CM | POA: Diagnosis not present

## 2020-02-13 DIAGNOSIS — G720 Drug-induced myopathy: Secondary | ICD-10-CM | POA: Diagnosis not present

## 2020-02-13 DIAGNOSIS — Z6836 Body mass index (BMI) 36.0-36.9, adult: Secondary | ICD-10-CM | POA: Diagnosis not present

## 2020-02-13 DIAGNOSIS — I700 Atherosclerosis of aorta: Secondary | ICD-10-CM | POA: Diagnosis not present

## 2020-02-13 DIAGNOSIS — R911 Solitary pulmonary nodule: Secondary | ICD-10-CM | POA: Diagnosis not present

## 2020-02-13 DIAGNOSIS — I10 Essential (primary) hypertension: Secondary | ICD-10-CM | POA: Diagnosis not present

## 2020-02-13 DIAGNOSIS — E039 Hypothyroidism, unspecified: Secondary | ICD-10-CM | POA: Diagnosis not present

## 2020-02-13 DIAGNOSIS — Z139 Encounter for screening, unspecified: Secondary | ICD-10-CM | POA: Diagnosis not present

## 2020-02-13 DIAGNOSIS — H9190 Unspecified hearing loss, unspecified ear: Secondary | ICD-10-CM | POA: Diagnosis not present

## 2020-02-13 DIAGNOSIS — R7303 Prediabetes: Secondary | ICD-10-CM | POA: Diagnosis not present

## 2020-02-13 DIAGNOSIS — E782 Mixed hyperlipidemia: Secondary | ICD-10-CM | POA: Diagnosis not present

## 2020-02-13 DIAGNOSIS — Z1331 Encounter for screening for depression: Secondary | ICD-10-CM | POA: Diagnosis not present

## 2020-02-13 DIAGNOSIS — E559 Vitamin D deficiency, unspecified: Secondary | ICD-10-CM | POA: Diagnosis not present

## 2020-03-12 ENCOUNTER — Other Ambulatory Visit: Payer: Self-pay | Admitting: Nurse Practitioner

## 2020-03-12 DIAGNOSIS — Z1231 Encounter for screening mammogram for malignant neoplasm of breast: Secondary | ICD-10-CM

## 2020-03-31 DIAGNOSIS — R1111 Vomiting without nausea: Secondary | ICD-10-CM | POA: Diagnosis not present

## 2020-03-31 DIAGNOSIS — R197 Diarrhea, unspecified: Secondary | ICD-10-CM | POA: Diagnosis not present

## 2020-04-01 DIAGNOSIS — Z79890 Hormone replacement therapy (postmenopausal): Secondary | ICD-10-CM | POA: Diagnosis not present

## 2020-04-01 DIAGNOSIS — I10 Essential (primary) hypertension: Secondary | ICD-10-CM | POA: Diagnosis not present

## 2020-04-01 DIAGNOSIS — K858 Other acute pancreatitis without necrosis or infection: Secondary | ICD-10-CM | POA: Diagnosis not present

## 2020-04-01 DIAGNOSIS — K853 Drug induced acute pancreatitis without necrosis or infection: Secondary | ICD-10-CM | POA: Diagnosis not present

## 2020-04-01 DIAGNOSIS — Z79899 Other long term (current) drug therapy: Secondary | ICD-10-CM | POA: Diagnosis not present

## 2020-04-01 DIAGNOSIS — Z6835 Body mass index (BMI) 35.0-35.9, adult: Secondary | ICD-10-CM | POA: Diagnosis not present

## 2020-04-01 DIAGNOSIS — K859 Acute pancreatitis without necrosis or infection, unspecified: Secondary | ICD-10-CM | POA: Diagnosis not present

## 2020-04-01 DIAGNOSIS — Z20822 Contact with and (suspected) exposure to covid-19: Secondary | ICD-10-CM | POA: Diagnosis not present

## 2020-04-01 DIAGNOSIS — E7800 Pure hypercholesterolemia, unspecified: Secondary | ICD-10-CM | POA: Diagnosis not present

## 2020-04-01 DIAGNOSIS — E039 Hypothyroidism, unspecified: Secondary | ICD-10-CM | POA: Insufficient documentation

## 2020-04-01 DIAGNOSIS — K828 Other specified diseases of gallbladder: Secondary | ICD-10-CM | POA: Diagnosis not present

## 2020-04-01 DIAGNOSIS — K760 Fatty (change of) liver, not elsewhere classified: Secondary | ICD-10-CM | POA: Diagnosis not present

## 2020-04-01 DIAGNOSIS — R109 Unspecified abdominal pain: Secondary | ICD-10-CM | POA: Diagnosis not present

## 2020-04-01 DIAGNOSIS — K8500 Idiopathic acute pancreatitis without necrosis or infection: Secondary | ICD-10-CM | POA: Diagnosis not present

## 2020-04-01 DIAGNOSIS — K829 Disease of gallbladder, unspecified: Secondary | ICD-10-CM | POA: Diagnosis not present

## 2020-04-01 DIAGNOSIS — Z7901 Long term (current) use of anticoagulants: Secondary | ICD-10-CM | POA: Diagnosis not present

## 2020-04-01 DIAGNOSIS — E785 Hyperlipidemia, unspecified: Secondary | ICD-10-CM | POA: Diagnosis not present

## 2020-04-01 DIAGNOSIS — R1110 Vomiting, unspecified: Secondary | ICD-10-CM | POA: Diagnosis not present

## 2020-04-01 DIAGNOSIS — R7989 Other specified abnormal findings of blood chemistry: Secondary | ICD-10-CM | POA: Diagnosis not present

## 2020-04-01 DIAGNOSIS — R1013 Epigastric pain: Secondary | ICD-10-CM | POA: Diagnosis not present

## 2020-04-01 DIAGNOSIS — K8050 Calculus of bile duct without cholangitis or cholecystitis without obstruction: Secondary | ICD-10-CM | POA: Diagnosis not present

## 2020-04-01 DIAGNOSIS — Z7982 Long term (current) use of aspirin: Secondary | ICD-10-CM | POA: Diagnosis not present

## 2020-04-02 DIAGNOSIS — K858 Other acute pancreatitis without necrosis or infection: Secondary | ICD-10-CM | POA: Diagnosis not present

## 2020-04-02 DIAGNOSIS — Z7901 Long term (current) use of anticoagulants: Secondary | ICD-10-CM | POA: Diagnosis not present

## 2020-04-02 DIAGNOSIS — Z7982 Long term (current) use of aspirin: Secondary | ICD-10-CM | POA: Diagnosis not present

## 2020-04-02 DIAGNOSIS — E039 Hypothyroidism, unspecified: Secondary | ICD-10-CM | POA: Diagnosis not present

## 2020-04-02 DIAGNOSIS — K760 Fatty (change of) liver, not elsewhere classified: Secondary | ICD-10-CM | POA: Diagnosis not present

## 2020-04-02 DIAGNOSIS — K859 Acute pancreatitis without necrosis or infection, unspecified: Secondary | ICD-10-CM | POA: Diagnosis not present

## 2020-04-02 DIAGNOSIS — I10 Essential (primary) hypertension: Secondary | ICD-10-CM | POA: Diagnosis not present

## 2020-04-02 DIAGNOSIS — K828 Other specified diseases of gallbladder: Secondary | ICD-10-CM | POA: Diagnosis not present

## 2020-04-02 DIAGNOSIS — R7989 Other specified abnormal findings of blood chemistry: Secondary | ICD-10-CM | POA: Diagnosis not present

## 2020-04-02 DIAGNOSIS — Z79890 Hormone replacement therapy (postmenopausal): Secondary | ICD-10-CM | POA: Diagnosis not present

## 2020-04-03 DIAGNOSIS — Z79899 Other long term (current) drug therapy: Secondary | ICD-10-CM | POA: Diagnosis not present

## 2020-04-03 DIAGNOSIS — Z79890 Hormone replacement therapy (postmenopausal): Secondary | ICD-10-CM | POA: Diagnosis not present

## 2020-04-03 DIAGNOSIS — E039 Hypothyroidism, unspecified: Secondary | ICD-10-CM | POA: Diagnosis not present

## 2020-04-03 DIAGNOSIS — I10 Essential (primary) hypertension: Secondary | ICD-10-CM | POA: Diagnosis not present

## 2020-04-03 DIAGNOSIS — K8500 Idiopathic acute pancreatitis without necrosis or infection: Secondary | ICD-10-CM | POA: Diagnosis not present

## 2020-04-11 DIAGNOSIS — E559 Vitamin D deficiency, unspecified: Secondary | ICD-10-CM | POA: Diagnosis not present

## 2020-04-11 DIAGNOSIS — K859 Acute pancreatitis without necrosis or infection, unspecified: Secondary | ICD-10-CM | POA: Diagnosis not present

## 2020-04-11 DIAGNOSIS — Z79899 Other long term (current) drug therapy: Secondary | ICD-10-CM | POA: Diagnosis not present

## 2020-04-11 DIAGNOSIS — E782 Mixed hyperlipidemia: Secondary | ICD-10-CM | POA: Diagnosis not present

## 2020-04-11 DIAGNOSIS — Z9181 History of falling: Secondary | ICD-10-CM | POA: Diagnosis not present

## 2020-04-11 DIAGNOSIS — I10 Essential (primary) hypertension: Secondary | ICD-10-CM | POA: Diagnosis not present

## 2020-04-11 DIAGNOSIS — E039 Hypothyroidism, unspecified: Secondary | ICD-10-CM | POA: Diagnosis not present

## 2020-04-11 DIAGNOSIS — Z6835 Body mass index (BMI) 35.0-35.9, adult: Secondary | ICD-10-CM | POA: Diagnosis not present

## 2020-04-11 DIAGNOSIS — R7303 Prediabetes: Secondary | ICD-10-CM | POA: Diagnosis not present

## 2020-04-25 ENCOUNTER — Ambulatory Visit: Payer: PPO

## 2020-06-06 ENCOUNTER — Ambulatory Visit: Payer: Self-pay | Admitting: Physician Assistant

## 2020-06-14 ENCOUNTER — Other Ambulatory Visit: Payer: Self-pay

## 2020-06-14 ENCOUNTER — Ambulatory Visit
Admission: RE | Admit: 2020-06-14 | Discharge: 2020-06-14 | Disposition: A | Discharge status: Home / Self Care | Payer: PPO | Source: Ambulatory Visit | Attending: Nurse Practitioner | Admitting: Nurse Practitioner

## 2020-06-14 ENCOUNTER — Ambulatory Visit
Admission: RE | Admit: 2020-06-14 | Discharge: 2020-06-14 | Disposition: A | Discharge status: Home / Self Care | Payer: PPO | Source: Ambulatory Visit | Attending: Urology | Admitting: Urology

## 2020-06-14 DIAGNOSIS — I2510 Atherosclerotic heart disease of native coronary artery without angina pectoris: Secondary | ICD-10-CM | POA: Diagnosis not present

## 2020-06-14 DIAGNOSIS — R918 Other nonspecific abnormal finding of lung field: Secondary | ICD-10-CM | POA: Diagnosis not present

## 2020-06-14 DIAGNOSIS — Z1231 Encounter for screening mammogram for malignant neoplasm of breast: Secondary | ICD-10-CM

## 2020-06-14 DIAGNOSIS — J984 Other disorders of lung: Secondary | ICD-10-CM | POA: Diagnosis not present

## 2020-06-14 DIAGNOSIS — M47814 Spondylosis without myelopathy or radiculopathy, thoracic region: Secondary | ICD-10-CM | POA: Diagnosis not present

## 2020-06-14 DIAGNOSIS — J432 Centrilobular emphysema: Secondary | ICD-10-CM | POA: Diagnosis not present

## 2020-06-17 ENCOUNTER — Ambulatory Visit: Payer: PPO | Admitting: Physician Assistant

## 2020-06-17 ENCOUNTER — Encounter: Payer: Self-pay | Admitting: Physician Assistant

## 2020-06-17 ENCOUNTER — Telehealth: Payer: Self-pay | Admitting: *Deleted

## 2020-06-17 ENCOUNTER — Other Ambulatory Visit: Payer: Self-pay

## 2020-06-17 VITALS — BP 184/64 | HR 53 | Ht 62.0 in | Wt 195.0 lb

## 2020-06-17 DIAGNOSIS — Z9189 Other specified personal risk factors, not elsewhere classified: Secondary | ICD-10-CM

## 2020-06-17 DIAGNOSIS — R911 Solitary pulmonary nodule: Secondary | ICD-10-CM

## 2020-06-17 DIAGNOSIS — R319 Hematuria, unspecified: Secondary | ICD-10-CM

## 2020-06-17 NOTE — Telephone Encounter (Addendum)
Left VM asked to return call with any questions.  ----- Message from Hollice Espy, MD sent at 06/17/2020  3:58 PM EDT ----- Please let this patient know that her chest CT looked fine.  The lung nodule seen on previous imaging is stable and does not need follow-up per guidelines.  Hollice Espy, MD

## 2020-06-17 NOTE — Progress Notes (Signed)
06/17/2020 5:20 PM   Bernadene Person Oct 04, 1950 [id number]  CC: Chief Complaint  Patient presents with  . Hematuria    HPI: Jacqueline Herrera is a 71 y.o. female with PMH hematuria and pulmonary nodule who presents today for annual follow-up.  Today she reports no new urinary symptoms over the past year and specifically denies dysuria, urgency, frequency, urinary leakage, gross hematuria, and flank pain.  She had a mild case of COVID and was hospitalized with pancreatitis in the last year, however otherwise she is doing well and has no acute concerns today.  She underwent CT chest without contrast on 06/14/2020 revealed a stable 7 mm right perifissural nodule.  No further follow-up recommended.  In-office UA today positive for trace intact blood and 2+ leukocyte esterase; urine microscopy pan negative.  PMH: Past Medical History:  Diagnosis Date  . Hyperlipidemia   . Hypertension   . Hypothyroid     Surgical History: Past Surgical History:  Procedure Laterality Date  . PARTIAL HYSTERECTOMY    . right hip replacement      Home Medications:  Allergies as of 06/17/2020      Reactions   Codeine Nausea And Vomiting   Tramadol Nausea And Vomiting   Nausea.      Medication List       Accurate as of Jun 17, 2020  5:20 PM. If you have any questions, ask your nurse or doctor.        aspirin 81 MG EC tablet Take by mouth.   Co Q 10 100 MG Caps   enalapril 5 MG tablet Commonly known as: VASOTEC Take 5 mg by mouth daily.   ergocalciferol 1.25 MG (50000 UT) capsule Commonly known as: VITAMIN D2 Take by mouth.   Fish Oil 1200 MG Caps Take by mouth.   hydrochlorothiazide 25 MG tablet Commonly known as: HYDRODIURIL Take by mouth.   levothyroxine 112 MCG tablet Commonly known as: SYNTHROID Take by mouth.   levothyroxine 100 MCG tablet Commonly known as: SYNTHROID Take 100 mcg by mouth daily.   rosuvastatin 10 MG tablet Commonly known as: CRESTOR Take 10 mg  by mouth at bedtime.   Vitamin D3 50 MCG (2000 UT) Chew       Allergies:  Allergies  Allergen Reactions  . Codeine Nausea And Vomiting  . Tramadol Nausea And Vomiting    Nausea.     Family History: Family History  Problem Relation Age of Onset  . Breast cancer Sister 11    Social History:   reports that she has quit smoking. She has never used smokeless tobacco. She reports previous alcohol use. No history on file for drug use.  Physical Exam: BP (!) 184/64   Pulse (!) 53   Ht 5\' 2"  (1.575 m)   Wt 195 lb (88.5 kg)   BMI 35.67 kg/m   Constitutional:  Alert and oriented, no acute distress, nontoxic appearing HEENT: [place], AT Cardiovascular: No clubbing, cyanosis, or edema Respiratory: Normal respiratory effort, no increased work of breathing Skin: No rashes, bruises or suspicious lesions Neurologic: Grossly intact, no focal deficits, moving all 4 extremities Psychiatric: Normal mood and affect  Laboratory Data: Results for orders placed or performed in visit on 06/17/20  Microscopic Examination   Urine  Result Value Ref Range   WBC, UA 0-5 0 - 5 /hpf   RBC 0-2 0 - 2 /hpf   Epithelial Cells (non renal) 0-10 0 - 10 /hpf   Bacteria, UA None seen  None seen/Few  Urinalysis, Complete  Result Value Ref Range   Specific Gravity, UA <1.005 (L) 1.005 - 1.030   pH, UA 5.0 5.0 - 7.5   Color, UA Straw Yellow   Appearance Ur Hazy (A) Clear   Leukocytes,UA 2+ (A) Negative   Protein,UA Negative Negative/Trace   Glucose, UA Negative Negative   Ketones, UA Negative Negative   RBC, UA Trace (A) Negative   Bilirubin, UA Negative Negative   Urobilinogen, Ur 0.2 0.2 - 1.0 mg/dL   Nitrite, UA Negative Negative   Microscopic Examination See below:    Assessment & Plan:   1. Hematuria, unspecified type No hematuria on urine microscopy today and patient remains asymptomatic.  We discussed the need for follow-up with new gross hematuria, dysuria, or flank pain.  She expressed  understanding. - Urinalysis, Complete  2. Pulmonary nodule less than 1 cm in diameter with moderate to high risk for malignant neoplasm Stable on repeat chest CT with no need for repeat imaging.  Return if symptoms worsen or fail to improve.  Debroah Loop, PA-C  [facility] [address] [place], [place] [address] [phone]

## 2020-06-18 LAB — MICROSCOPIC EXAMINATION: Bacteria, UA: NONE SEEN

## 2020-06-18 LAB — URINALYSIS, COMPLETE
Bilirubin, UA: NEGATIVE
Glucose, UA: NEGATIVE
Ketones, UA: NEGATIVE
Nitrite, UA: NEGATIVE
Protein,UA: NEGATIVE
Specific Gravity, UA: <1.005 — ABNORMAL LOW (ref 1.005–1.030)
Urobilinogen, Ur: 0.2 mg/dL (ref 0.2–1.0)
pH, UA: 5 (ref 5.0–7.5)

## 2020-07-14 IMAGING — MG DIGITAL SCREENING BILAT W/ TOMO W/ CAD
8 series · 8 of 24 positions shown · non-contrast
Comparison: Previous exam(s).

CLINICAL DATA: Screening.

EXAM:
DIGITAL SCREENING BILATERAL MAMMOGRAM WITH TOMO AND CAD

[R MLO synth-2D]
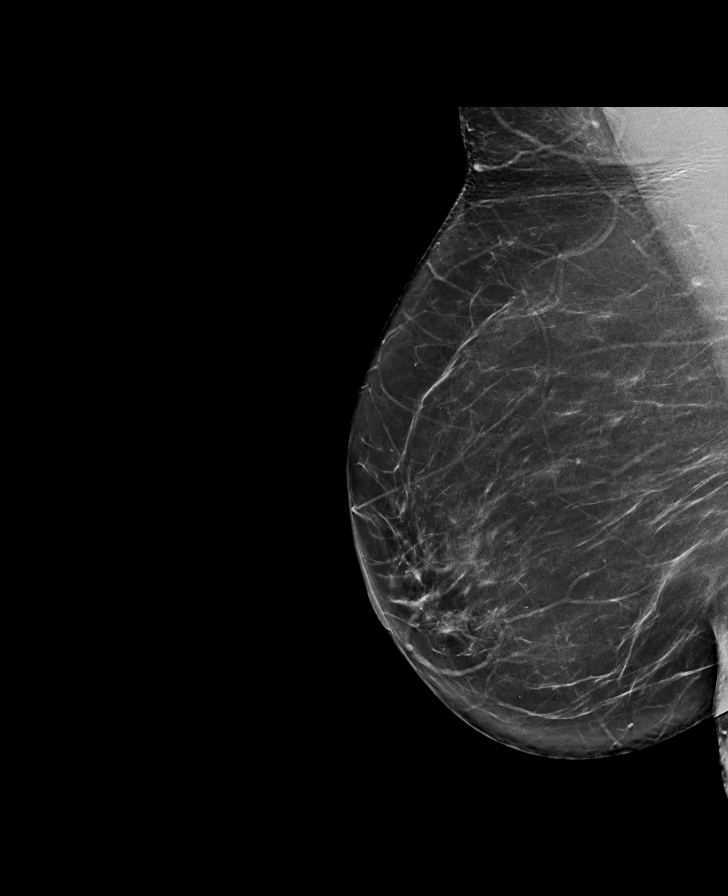

[R CC synth-2D]
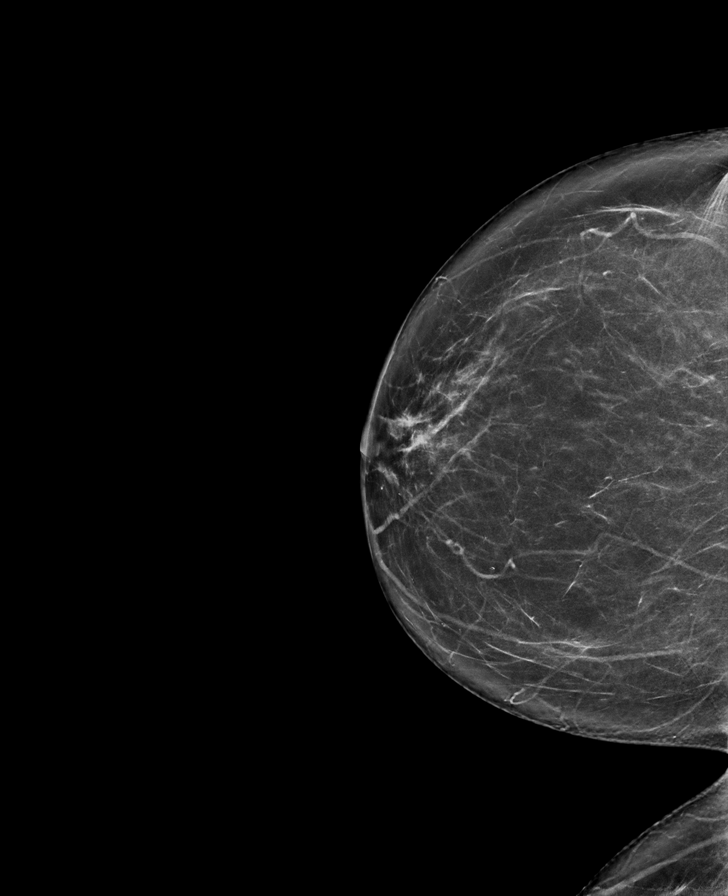

[L MLO synth-2D]
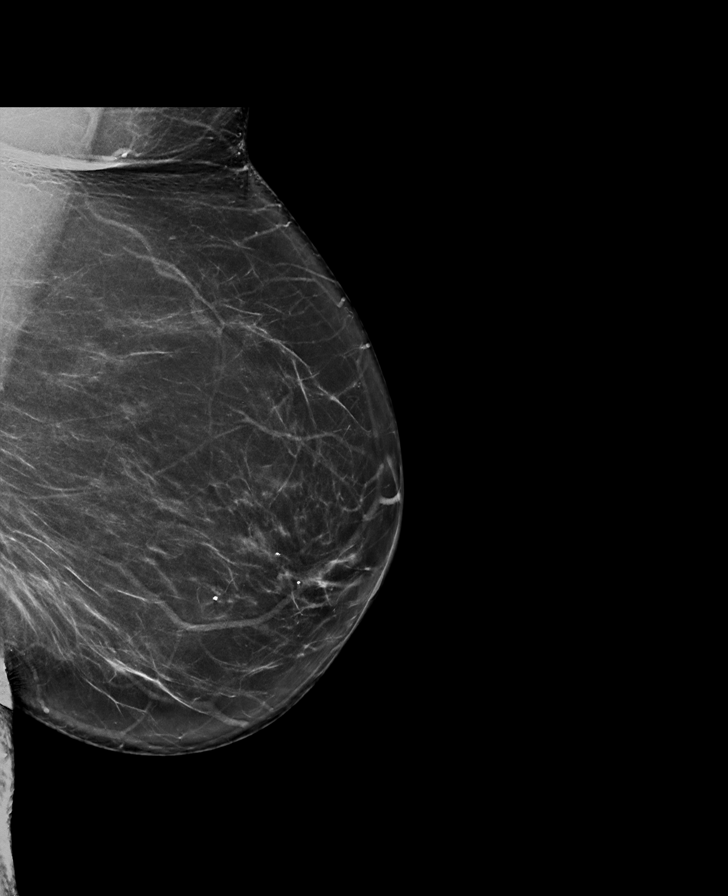

[L CC synth-2D]
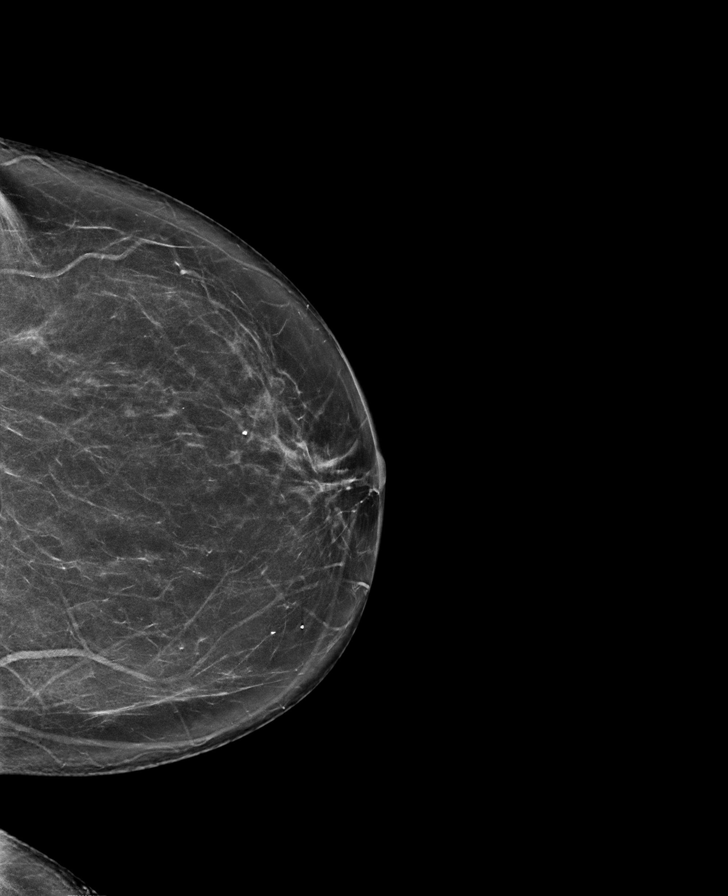

[R CC tomo · tomo slice 38/75.0]
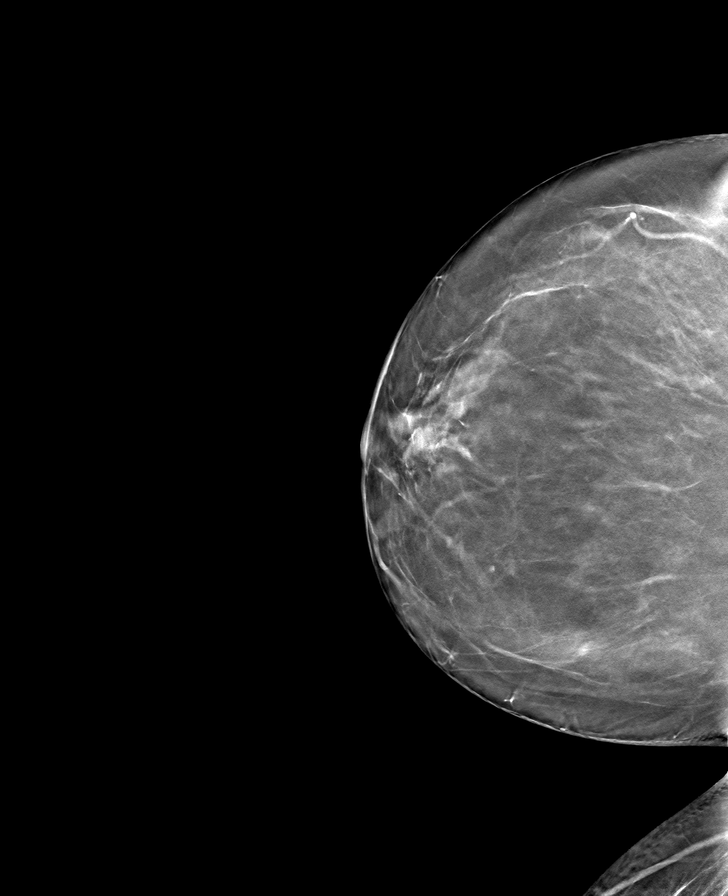

[L CC tomo · tomo slice 39/76.0]
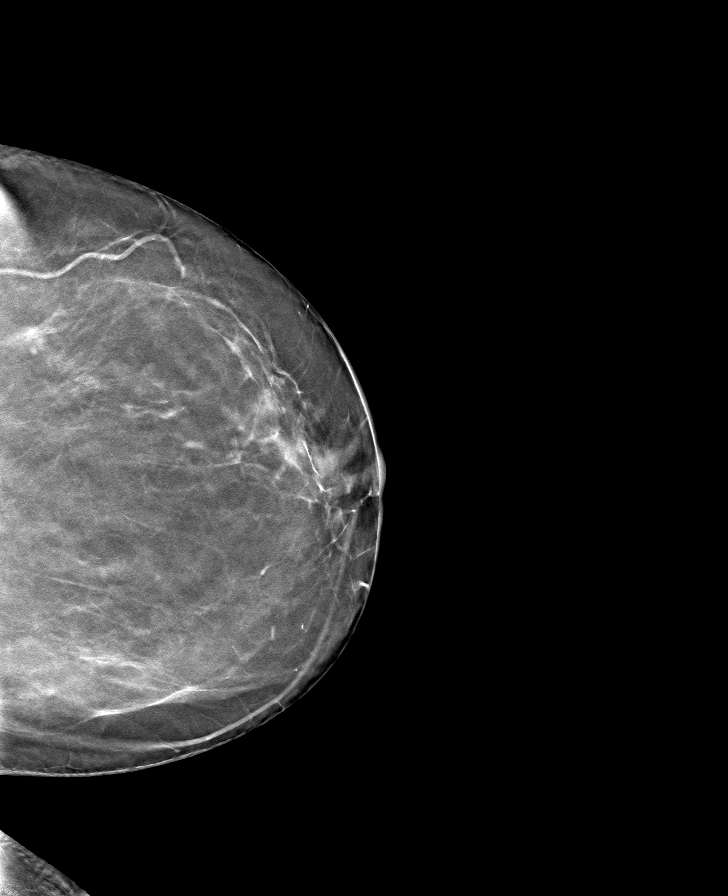

[R MLO tomo · tomo slice 43/84.0]
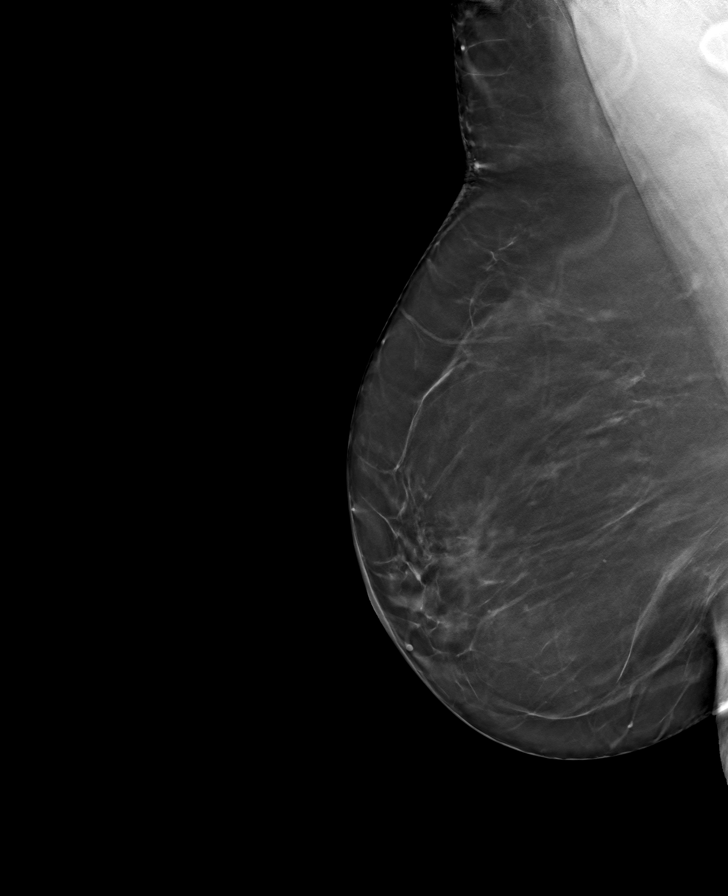

[L MLO tomo · tomo slice 41/82.0]
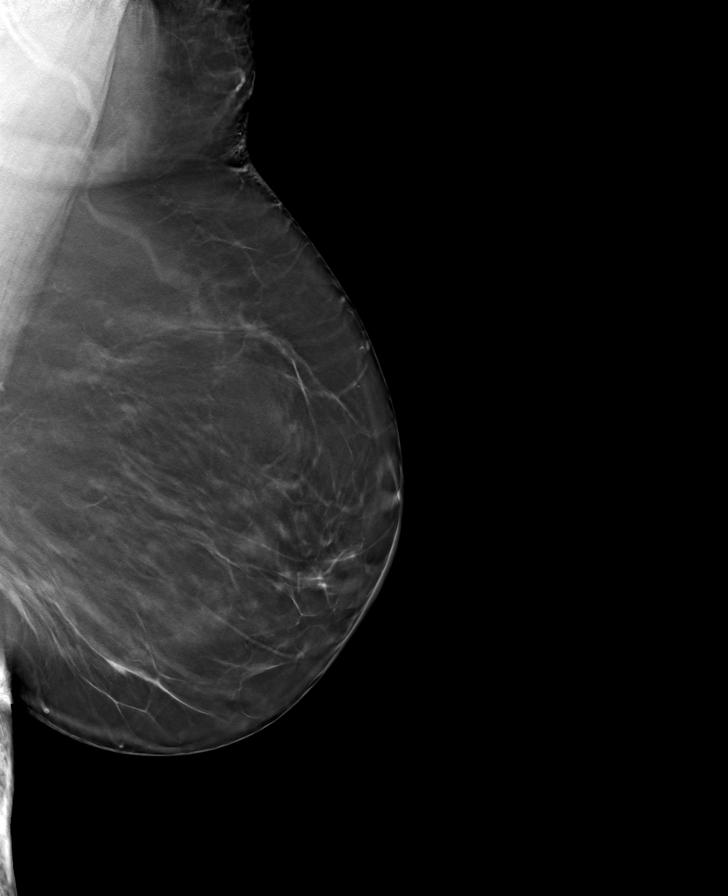

[8 of 24 positions shown; findings below may reference images not displayed]

ACR Breast Density Category b: There are scattered areas of
fibroglandular density.
FINDINGS: There are no findings suspicious for malignancy. Images were
processed with CAD.
IMPRESSION: No mammographic evidence of malignancy. A result letter of this
screening mammogram will be mailed directly to the patient.

RECOMMENDATION:
Screening mammogram in one year. (Code:[id number])

BI-RADS CATEGORY  1: Negative.

## 2020-08-15 DIAGNOSIS — G720 Drug-induced myopathy: Secondary | ICD-10-CM | POA: Diagnosis not present

## 2020-08-15 DIAGNOSIS — E782 Mixed hyperlipidemia: Secondary | ICD-10-CM | POA: Diagnosis not present

## 2020-08-15 DIAGNOSIS — R7303 Prediabetes: Secondary | ICD-10-CM | POA: Diagnosis not present

## 2020-08-15 DIAGNOSIS — I10 Essential (primary) hypertension: Secondary | ICD-10-CM | POA: Diagnosis not present

## 2020-08-15 DIAGNOSIS — Z6835 Body mass index (BMI) 35.0-35.9, adult: Secondary | ICD-10-CM | POA: Diagnosis not present

## 2020-08-15 DIAGNOSIS — E039 Hypothyroidism, unspecified: Secondary | ICD-10-CM | POA: Diagnosis not present

## 2020-08-15 DIAGNOSIS — I700 Atherosclerosis of aorta: Secondary | ICD-10-CM | POA: Diagnosis not present

## 2020-08-15 DIAGNOSIS — R911 Solitary pulmonary nodule: Secondary | ICD-10-CM | POA: Diagnosis not present

## 2020-08-15 DIAGNOSIS — E559 Vitamin D deficiency, unspecified: Secondary | ICD-10-CM | POA: Diagnosis not present

## 2020-08-15 DIAGNOSIS — H9190 Unspecified hearing loss, unspecified ear: Secondary | ICD-10-CM | POA: Diagnosis not present

## 2020-12-03 DIAGNOSIS — Z8719 Personal history of other diseases of the digestive system: Secondary | ICD-10-CM | POA: Diagnosis not present

## 2020-12-03 DIAGNOSIS — K5730 Diverticulosis of large intestine without perforation or abscess without bleeding: Secondary | ICD-10-CM | POA: Diagnosis not present

## 2020-12-03 DIAGNOSIS — Z1211 Encounter for screening for malignant neoplasm of colon: Secondary | ICD-10-CM | POA: Diagnosis not present

## 2021-01-06 DIAGNOSIS — H26493 Other secondary cataract, bilateral: Secondary | ICD-10-CM | POA: Diagnosis not present

## 2021-01-06 DIAGNOSIS — H02834 Dermatochalasis of left upper eyelid: Secondary | ICD-10-CM | POA: Diagnosis not present

## 2021-01-06 DIAGNOSIS — H02839 Dermatochalasis of unspecified eye, unspecified eyelid: Secondary | ICD-10-CM | POA: Diagnosis not present

## 2021-01-06 DIAGNOSIS — H1045 Other chronic allergic conjunctivitis: Secondary | ICD-10-CM | POA: Diagnosis not present

## 2021-01-06 DIAGNOSIS — H40013 Open angle with borderline findings, low risk, bilateral: Secondary | ICD-10-CM | POA: Diagnosis not present

## 2021-02-11 DIAGNOSIS — H02831 Dermatochalasis of right upper eyelid: Secondary | ICD-10-CM | POA: Diagnosis not present

## 2021-02-11 DIAGNOSIS — H26493 Other secondary cataract, bilateral: Secondary | ICD-10-CM | POA: Diagnosis not present

## 2021-02-11 DIAGNOSIS — H1045 Other chronic allergic conjunctivitis: Secondary | ICD-10-CM | POA: Diagnosis not present

## 2021-02-11 DIAGNOSIS — H40019 Open angle with borderline findings, low risk, unspecified eye: Secondary | ICD-10-CM | POA: Diagnosis not present

## 2021-02-11 DIAGNOSIS — H02834 Dermatochalasis of left upper eyelid: Secondary | ICD-10-CM | POA: Diagnosis not present

## 2021-02-14 DIAGNOSIS — E559 Vitamin D deficiency, unspecified: Secondary | ICD-10-CM | POA: Diagnosis not present

## 2021-02-14 DIAGNOSIS — Z1331 Encounter for screening for depression: Secondary | ICD-10-CM | POA: Diagnosis not present

## 2021-02-14 DIAGNOSIS — Z139 Encounter for screening, unspecified: Secondary | ICD-10-CM | POA: Diagnosis not present

## 2021-02-14 DIAGNOSIS — R7303 Prediabetes: Secondary | ICD-10-CM | POA: Diagnosis not present

## 2021-02-14 DIAGNOSIS — E039 Hypothyroidism, unspecified: Secondary | ICD-10-CM | POA: Diagnosis not present

## 2021-02-14 DIAGNOSIS — E782 Mixed hyperlipidemia: Secondary | ICD-10-CM | POA: Diagnosis not present

## 2021-02-14 DIAGNOSIS — Z6836 Body mass index (BMI) 36.0-36.9, adult: Secondary | ICD-10-CM | POA: Diagnosis not present

## 2021-02-14 DIAGNOSIS — I10 Essential (primary) hypertension: Secondary | ICD-10-CM | POA: Diagnosis not present

## 2021-02-27 ENCOUNTER — Encounter: Payer: Self-pay | Admitting: Gastroenterology

## 2021-02-27 NOTE — H&P (Signed)
Pre-Procedure H&P   Patient ID: Jacqueline Herrera is a 71 y.o. female.  Gastroenterology Provider: Annamaria Helling, DO  Referring Provider: Octavia Bruckner, PA PCP: [facility]  Date: 02/28/2021  HPI Ms. Jacqueline Herrera is a 71 y.o. female who presents today for Colonoscopy for screening colonoscopy.  Last underwent colonoscopy 04/2010 with Dr. Gustavo Lah demonstrating left sided diverticulosis, otherwise normal. No fhx colon polyps or crc. Pt did have pancreatitis in 2022. S/p hip replacement (r). BM have been regular w/o blood, melena, diarrhea, constipation.  Hgb 10.9, mcv 86 plt 194; cr 0.7 S/p hysterectomy.  Past Medical History:  Diagnosis Date   Asthma    Dyspnea    Hyperlipidemia    Hypertension    Hypothyroid     Past Surgical History:  Procedure Laterality Date   PARTIAL HYSTERECTOMY     right hip replacement      Family History No h/o GI disease or malignancy  Review of Systems  Constitutional:  Negative for activity change, appetite change, chills, diaphoresis, fatigue, fever and unexpected weight change.  HENT:  Negative for trouble swallowing and voice change.   Respiratory:  Negative for shortness of breath and wheezing.   Cardiovascular:  Negative for chest pain, palpitations and leg swelling.  Gastrointestinal:  Negative for abdominal distention, abdominal pain, anal bleeding, blood in stool, constipation, diarrhea, nausea, rectal pain and vomiting.  Musculoskeletal:  Negative for arthralgias and myalgias.  Skin:  Negative for color change and pallor.  Neurological:  Negative for dizziness, syncope and weakness.  Psychiatric/Behavioral:  Negative for confusion.   All other systems reviewed and are negative.   Medications No current facility-administered medications on file prior to encounter.   Current Outpatient Medications on File Prior to Encounter  Medication Sig Dispense Refill   aspirin 81 MG EC tablet Take by mouth.      hydrochlorothiazide (HYDRODIURIL) 25 MG tablet Take by mouth.     levothyroxine (SYNTHROID) 100 MCG tablet Take 100 mcg by mouth daily.     Cholecalciferol (VITAMIN D3) 50 MCG (2000 UT) CHEW      Coenzyme Q10 (CO Q 10) 100 MG CAPS  (Patient not taking: Reported on 06/17/2020)     enalapril (VASOTEC) 5 MG tablet Take 5 mg by mouth daily. (Patient not taking: Reported on 06/17/2020)     ergocalciferol (VITAMIN D2) 1.25 MG (50000 UT) capsule Take by mouth. (Patient not taking: Reported on 06/17/2020)     levothyroxine (SYNTHROID) 112 MCG tablet Take by mouth.     Omega-3 Fatty Acids (FISH OIL) 1200 MG CAPS Take by mouth.     rosuvastatin (CRESTOR) 10 MG tablet Take 10 mg by mouth at bedtime.      Pertinent medications related to GI and procedure were reviewed by me with the patient prior to the procedure   Current Facility-Administered Medications:    0.9 %  sodium chloride infusion, , Intravenous, Continuous, Annamaria Helling, DO, Last Rate: 20 mL/hr at 02/28/21 1000, New Bag at 02/28/21 1000      Allergies  Allergen Reactions   Codeine Nausea And Vomiting   Tramadol Nausea And Vomiting    Nausea.    Allergies were reviewed by me prior to the procedure  Objective    Vitals:   02/28/21 0945  BP: (!) 146/57  Pulse: 85  Resp: 18  Temp: (!) 97.3 F (36.3 C)  TempSrc: Temporal  SpO2: 98%  Weight: 88.5 kg  Height: 5\' 2"  (1.575 m)  Physical Exam Vitals and nursing note reviewed.  Constitutional:      General: She is not in acute distress.    Appearance: Normal appearance. She is obese. She is not ill-appearing, toxic-appearing or diaphoretic.  HENT:     Head: Normocephalic and atraumatic.     Nose: Nose normal.     Mouth/Throat:     Mouth: Mucous membranes are moist.     Pharynx: Oropharynx is clear.  Eyes:     General: No scleral icterus.    Extraocular Movements: Extraocular movements intact.  Cardiovascular:     Rate and Rhythm: Normal rate and regular rhythm.      Heart sounds: Normal heart sounds. No murmur heard.   No friction rub. No gallop.  Pulmonary:     Effort: Pulmonary effort is normal. No respiratory distress.     Breath sounds: Normal breath sounds. No wheezing, rhonchi or rales.  Abdominal:     General: Bowel sounds are normal. There is no distension.     Palpations: Abdomen is soft.     Tenderness: There is no abdominal tenderness. There is no guarding or rebound.  Musculoskeletal:     Cervical back: Neck supple.     Right lower leg: No edema.     Left lower leg: No edema.  Skin:    General: Skin is warm and dry.     Coloration: Skin is not jaundiced or pale.  Neurological:     General: No focal deficit present.     Mental Status: She is alert and oriented to person, place, and time. Mental status is at baseline.  Psychiatric:        Mood and Affect: Mood normal.        Behavior: Behavior normal.        Thought Content: Thought content normal.        Judgment: Judgment normal.     Assessment:  Ms. Jacqueline Herrera is a 71 y.o. female  who presents today for Colonoscopy for screening colonoscopy.  Plan:  Colonoscopy with possible intervention today  Colonoscopy with possible biopsy, control of bleeding, polypectomy, and interventions as necessary has been discussed with the patient/patient representative. Informed consent was obtained from the patient/patient representative after explaining the indication, nature, and risks of the procedure including but not limited to death, bleeding, perforation, missed neoplasm/lesions, cardiorespiratory compromise, and reaction to medications. Opportunity for questions was given and appropriate answers were provided. Patient/patient representative has verbalized understanding is amenable to undergoing the procedure.   Annamaria Helling, DO  [facility]  Portions of the record may have been created with voice recognition software. Occasional wrong-word or  'sound-a-like' substitutions may have occurred due to the inherent limitations of voice recognition software.  Read the chart carefully and recognize, using context, where substitutions may have occurred.

## 2021-02-28 ENCOUNTER — Ambulatory Visit: Payer: PPO | Admitting: Anesthesiology

## 2021-02-28 ENCOUNTER — Ambulatory Visit
Admission: RE | Admit: 2021-02-28 | Discharge: 2021-02-28 | Disposition: A | Discharge status: Home / Self Care | Payer: PPO | Attending: Gastroenterology | Admitting: Gastroenterology

## 2021-02-28 ENCOUNTER — Encounter
Admission: RE | Disposition: A | Discharge status: Home / Self Care | Payer: Self-pay | Source: Home / Self Care | Attending: Gastroenterology

## 2021-02-28 ENCOUNTER — Encounter: Payer: Self-pay | Admitting: Gastroenterology

## 2021-02-28 DIAGNOSIS — Z96641 Presence of right artificial hip joint: Secondary | ICD-10-CM | POA: Diagnosis not present

## 2021-02-28 DIAGNOSIS — Z87891 Personal history of nicotine dependence: Secondary | ICD-10-CM | POA: Diagnosis not present

## 2021-02-28 DIAGNOSIS — Z8719 Personal history of other diseases of the digestive system: Secondary | ICD-10-CM | POA: Insufficient documentation

## 2021-02-28 DIAGNOSIS — Z1211 Encounter for screening for malignant neoplasm of colon: Secondary | ICD-10-CM | POA: Diagnosis not present

## 2021-02-28 DIAGNOSIS — K5730 Diverticulosis of large intestine without perforation or abscess without bleeding: Secondary | ICD-10-CM | POA: Diagnosis not present

## 2021-02-28 DIAGNOSIS — K649 Unspecified hemorrhoids: Secondary | ICD-10-CM | POA: Diagnosis not present

## 2021-02-28 DIAGNOSIS — K640 First degree hemorrhoids: Secondary | ICD-10-CM | POA: Diagnosis not present

## 2021-02-28 DIAGNOSIS — E785 Hyperlipidemia, unspecified: Secondary | ICD-10-CM | POA: Insufficient documentation

## 2021-02-28 DIAGNOSIS — I10 Essential (primary) hypertension: Secondary | ICD-10-CM | POA: Insufficient documentation

## 2021-02-28 DIAGNOSIS — K635 Polyp of colon: Secondary | ICD-10-CM | POA: Diagnosis not present

## 2021-02-28 DIAGNOSIS — E039 Hypothyroidism, unspecified: Secondary | ICD-10-CM | POA: Diagnosis not present

## 2021-02-28 DIAGNOSIS — J45909 Unspecified asthma, uncomplicated: Secondary | ICD-10-CM | POA: Diagnosis not present

## 2021-02-28 DIAGNOSIS — D123 Benign neoplasm of transverse colon: Secondary | ICD-10-CM | POA: Diagnosis not present

## 2021-02-28 DIAGNOSIS — K621 Rectal polyp: Secondary | ICD-10-CM | POA: Diagnosis not present

## 2021-02-28 DIAGNOSIS — D128 Benign neoplasm of rectum: Secondary | ICD-10-CM | POA: Diagnosis not present

## 2021-02-28 HISTORY — DX: Dyspnea, unspecified: R06.00

## 2021-02-28 HISTORY — DX: Unspecified asthma, uncomplicated: J45.909

## 2021-02-28 HISTORY — PX: COLONOSCOPY: SHX5424

## 2021-02-28 SURGERY — COLONOSCOPY
Anesthesia: General

## 2021-02-28 MED ORDER — SODIUM CHLORIDE 0.9 % IV SOLN: INTRAVENOUS | Status: DC

## 2021-02-28 MED ORDER — PROPOFOL 10 MG/ML IV BOLUS
INTRAVENOUS | Status: DC | PRN
Start: 2021-02-28 — End: 2021-02-28
  Administered 2021-02-28: 30 mg via INTRAVENOUS
  Administered 2021-02-28: 100 mg via INTRAVENOUS
  Administered 2021-02-28: 20 mg via INTRAVENOUS
  Administered 2021-02-28: 30 mg via INTRAVENOUS
  Administered 2021-02-28: 20 mg via INTRAVENOUS
  Administered 2021-02-28 (×3): 30 mg via INTRAVENOUS

## 2021-02-28 MED ORDER — PROPOFOL 10 MG/ML IV BOLUS
INTRAVENOUS | Status: AC
  Filled 2021-02-28: qty 40

## 2021-02-28 NOTE — Anesthesia Preprocedure Evaluation (Signed)
Anesthesia Evaluation  Patient identified by MRN, date of birth, ID band Patient awake    Reviewed: Allergy & Precautions, NPO status , Patient's Chart, lab work & pertinent test results  History of Anesthesia Complications Negative for: history of anesthetic complications  Airway Mallampati: III  TM Distance: >3 FB Neck ROM: full    Dental  (+) Chipped   Pulmonary shortness of breath and with exertion, asthma , former smoker,    Pulmonary exam normal        Cardiovascular Exercise Tolerance: Good hypertension, (-) anginaNormal cardiovascular exam     Neuro/Psych negative neurological ROS  negative psych ROS   GI/Hepatic negative GI ROS, Neg liver ROS, neg GERD  ,  Endo/Other  Hypothyroidism   Renal/GU negative Renal ROS  negative genitourinary   Musculoskeletal   Abdominal   Peds  Hematology negative hematology ROS (+)   Anesthesia Other Findings Past Medical History: No date: Asthma No date: Dyspnea No date: Hyperlipidemia No date: Hypertension No date: Hypothyroid  Past Surgical History: No date: PARTIAL HYSTERECTOMY No date: right hip replacement  BMI    Body Mass Index: 35.67 kg/m      Reproductive/Obstetrics negative OB ROS                             Anesthesia Physical Anesthesia Plan  ASA: 3  Anesthesia Plan: General   Post-op Pain Management:    Induction: Intravenous  PONV Risk Score and Plan: Propofol infusion and TIVA  Airway Management Planned: Natural Airway and Nasal Cannula  Additional Equipment:   Intra-op Plan:   Post-operative Plan:   Informed Consent: I have reviewed the patients History and Physical, chart, labs and discussed the procedure including the risks, benefits and alternatives for the proposed anesthesia with the patient or authorized representative who has indicated his/her understanding and acceptance.     Dental Advisory  Given  Plan Discussed with: Anesthesiologist, CRNA and Surgeon  Anesthesia Plan Comments: (Patient consented for risks of anesthesia including but not limited to:  - adverse reactions to medications - risk of airway placement if required - damage to eyes, teeth, lips or other oral mucosa - nerve damage due to positioning  - sore throat or hoarseness - Damage to heart, brain, nerves, lungs, other parts of body or loss of life  Patient voiced understanding.)        Anesthesia Quick Evaluation

## 2021-02-28 NOTE — Interval H&P Note (Signed)
History and Physical Interval Note: Preprocedure H&P from 02/28/21  was reviewed and there was no interval change after seeing and examining the patient.  Written consent was obtained from the patient after discussion of risks, benefits, and alternatives. Patient has consented to proceed with Colonoscopy with possible intervention   02/28/2021 10:10 AM  Jacqueline Herrera  has presented today for surgery, with the diagnosis of Colon cancer screening (Z12.11).  The various methods of treatment have been discussed with the patient and family. After consideration of risks, benefits and other options for treatment, the patient has consented to  Procedure(s): COLONOSCOPY (N/A) as a surgical intervention.  The patient's history has been reviewed, patient examined, no change in status, stable for surgery.  I have reviewed the patient's chart and labs.  Questions were answered to the patient's satisfaction.     Annamaria Helling

## 2021-02-28 NOTE — Anesthesia Postprocedure Evaluation (Signed)
Anesthesia Post Note  Patient: Jacqueline Herrera  Procedure(s) Performed: COLONOSCOPY  Patient location during evaluation: Endoscopy Anesthesia Type: General Level of consciousness: awake and alert Pain management: pain level controlled Vital Signs Assessment: post-procedure vital signs reviewed and stable Respiratory status: spontaneous breathing, nonlabored ventilation, respiratory function stable and patient connected to nasal cannula oxygen Cardiovascular status: blood pressure returned to baseline and stable Postop Assessment: no apparent nausea or vomiting Anesthetic complications: no   No notable events documented.   Last Vitals:  Vitals:   02/28/21 1049 02/28/21 1059  BP: 131/72 130/69  Pulse:    Resp:    Temp:    SpO2:      Last Pain:  Vitals:   02/28/21 1059  TempSrc:   PainSc: 0-No pain                 Precious Haws Wm Fruchter

## 2021-02-28 NOTE — Op Note (Signed)
[facility] Patient Name: Jacqueline Herrera Procedure Date: 02/28/2021 10:04 AM MRN: [id number] Account #: [id number] Date of Birth: 09/05/1950 Admit Type: Outpatient Age: 71 Room: [facility] ENDO ROOM 1 Gender: Female Note Status: Finalized Instrument Name: Colonscope [id number] Procedure:             Colonoscopy Indications:           Screening for colorectal malignant neoplasm Providers:             Annamaria Helling DO, DO Referring MD:          Philmore Pali (Referring MD) Medicines:             Monitored Anesthesia Care Complications:         No immediate complications. Estimated blood loss:                         Minimal. Procedure:             Pre-Anesthesia Assessment:                        - Prior to the procedure, a History and Physical was                         performed, and patient medications and allergies were                         reviewed. The patient is competent. The risks and                         benefits of the procedure and the sedation options and                         risks were discussed with the patient. All questions                         were answered and informed consent was obtained.                         Patient identification and proposed procedure were                         verified by the physician, the nurse, the anesthetist                         and the technician in the endoscopy suite. Mental                         Status Examination: alert and oriented. Airway                         Examination: normal oropharyngeal airway and neck                         mobility. Respiratory Examination: clear to                         auscultation. CV Examination: RRR, no murmurs, no S3  or S4. Prophylactic Antibiotics: The patient does not                         require prophylactic antibiotics. Prior                         Anticoagulants: The patient has taken no previous                          anticoagulant or antiplatelet agents. ASA Grade                         Assessment: III - A patient with severe systemic                         disease. After reviewing the risks and benefits, the                         patient was deemed in satisfactory condition to                         undergo the procedure. The anesthesia plan was to use                         monitored anesthesia care (MAC). Immediately prior to                         administration of medications, the patient was                         re-assessed for adequacy to receive sedatives. The                         heart rate, respiratory rate, oxygen saturations,                         blood pressure, adequacy of pulmonary ventilation, and                         response to care were monitored throughout the                         procedure. The physical status of the patient was                         re-assessed after the procedure.                        After obtaining informed consent, the colonoscope was                         passed under direct vision. Throughout the procedure,                         the patient's blood pressure, pulse, and oxygen                         saturations were monitored continuously. The  Colonoscope was introduced through the anus and                         advanced to the the terminal ileum, with                         identification of the appendiceal orifice and IC                         valve. The colonoscopy was performed without                         difficulty. The patient tolerated the procedure well.                         The quality of the bowel preparation was evaluated                         using the BBPS [facility] Bowel Preparation Scale) with                         scores of: Right Colon = 3, Transverse Colon = 3 and                         Left Colon = 3 (entire mucosa seen well with no                         residual  staining, small fragments of stool or opaque                         liquid). The total BBPS score equals 9. The terminal                         ileum, ileocecal valve, appendiceal orifice, and                         rectum were photographed. Findings:      The perianal and digital rectal examinations were normal. Pertinent       negatives include normal sphincter tone.      The terminal ileum appeared normal.      Two sessile polyps were found in the rectum and transverse colon. The       polyps were 1 to 2 mm in size. These polyps were removed with a cold       biopsy forceps. Resection and retrieval were complete. Estimated blood       loss was minimal.      A few small-mouthed diverticula were found in the recto-sigmoid colon.       Estimated blood loss: none.      Non-bleeding internal hemorrhoids were found during retroflexion. The       hemorrhoids were Grade I (internal hemorrhoids that do not prolapse). Impression:            - The examined portion of the ileum was normal.                        - Two 1 to 2 mm polyps in the rectum and in the  transverse colon, removed with a cold biopsy forceps.                         Resected and retrieved.                        - Diverticulosis in the recto-sigmoid colon.                        - Non-bleeding internal hemorrhoids. Recommendation:        - Discharge patient to home.                        - Resume previous diet.                        - Continue present medications.                        - Await pathology results.                        - Repeat colonoscopy for surveillance based on                         pathology results.                        - Return to referring physician as previously                         scheduled. Procedure Code(s):     --- Professional ---                        [phone], Colonoscopy, flexible; with biopsy, single or                         multiple Diagnosis Code(s):      --- Professional ---                        Z12.11, Encounter for screening for malignant neoplasm                         of colon                        K64.0, First degree hemorrhoids                        K62.1, Rectal polyp                        K63.5, Polyp of colon                        K57.30, Diverticulosis of large intestine without                         perforation or abscess without bleeding CPT copyright 2019 American Medical Association. All rights reserved. The codes documented in this report are preliminary and upon coder review may  be revised to meet current compliance requirements. Attending Participation:  I personally performed the entire procedure. Volney American, DO Annamaria Helling DO, DO 02/28/2021 10:43:52 AM This report has been signed electronically. Number of Addenda: 0 Note Initiated On: 02/28/2021 10:04 AM Scope Withdrawal Time: 0 hours 12 minutes 6 seconds  Total Procedure Duration: 0 hours 19 minutes 25 seconds  Estimated Blood Loss:  Estimated blood loss was minimal.      [facility]

## 2021-02-28 NOTE — Transfer of Care (Signed)
Immediate Anesthesia Transfer of Care Note  Patient: Jacqueline Herrera  Procedure(s) Performed: COLONOSCOPY  Patient Location: PACU and Endoscopy Unit  Anesthesia Type:General  Level of Consciousness: sedated  Airway & Oxygen Therapy: Patient Spontanous Breathing  Post-op Assessment: Report given to RN and Post -op Vital signs reviewed and stable  Post vital signs: Reviewed and stable  Last Vitals:  Vitals Value Taken Time  BP 112/61 02/28/21 1040  Temp 36.2 C 02/28/21 1039  Pulse 70 02/28/21 1041  Resp 15 02/28/21 1041  SpO2 96 % 02/28/21 1041  Vitals shown include unvalidated device data.  Last Pain:  Vitals:   02/28/21 1039  TempSrc: Temporal  PainSc: Asleep         Complications: No notable events documented.

## 2021-03-03 LAB — SURGICAL PATHOLOGY

## 2021-03-11 DIAGNOSIS — H5340 Unspecified visual field defects: Secondary | ICD-10-CM | POA: Diagnosis not present

## 2021-03-11 DIAGNOSIS — H02831 Dermatochalasis of right upper eyelid: Secondary | ICD-10-CM | POA: Diagnosis not present

## 2021-03-11 DIAGNOSIS — H02834 Dermatochalasis of left upper eyelid: Secondary | ICD-10-CM | POA: Diagnosis not present

## 2021-03-14 ENCOUNTER — Other Ambulatory Visit: Payer: Self-pay | Admitting: Physician Assistant

## 2021-03-14 DIAGNOSIS — Z1231 Encounter for screening mammogram for malignant neoplasm of breast: Secondary | ICD-10-CM

## 2021-05-27 DIAGNOSIS — H5340 Unspecified visual field defects: Secondary | ICD-10-CM | POA: Diagnosis not present

## 2021-05-27 DIAGNOSIS — H02839 Dermatochalasis of unspecified eye, unspecified eyelid: Secondary | ICD-10-CM | POA: Diagnosis not present

## 2021-06-09 DIAGNOSIS — I10 Essential (primary) hypertension: Secondary | ICD-10-CM | POA: Diagnosis not present

## 2021-06-09 DIAGNOSIS — M159 Polyosteoarthritis, unspecified: Secondary | ICD-10-CM | POA: Diagnosis not present

## 2021-06-09 DIAGNOSIS — Z9181 History of falling: Secondary | ICD-10-CM | POA: Diagnosis not present

## 2021-06-09 DIAGNOSIS — I493 Ventricular premature depolarization: Secondary | ICD-10-CM | POA: Diagnosis not present

## 2021-06-16 ENCOUNTER — Ambulatory Visit
Admission: RE | Admit: 2021-06-16 | Discharge: 2021-06-16 | Disposition: A | Discharge status: Home / Self Care | Payer: PPO | Source: Ambulatory Visit | Attending: Physician Assistant | Admitting: Physician Assistant

## 2021-06-16 DIAGNOSIS — Z1231 Encounter for screening mammogram for malignant neoplasm of breast: Secondary | ICD-10-CM

## 2021-08-21 DIAGNOSIS — I493 Ventricular premature depolarization: Secondary | ICD-10-CM | POA: Diagnosis not present

## 2021-08-21 DIAGNOSIS — E039 Hypothyroidism, unspecified: Secondary | ICD-10-CM | POA: Diagnosis not present

## 2021-08-21 DIAGNOSIS — M179 Osteoarthritis of knee, unspecified: Secondary | ICD-10-CM | POA: Diagnosis not present

## 2021-08-21 DIAGNOSIS — R7303 Prediabetes: Secondary | ICD-10-CM | POA: Diagnosis not present

## 2021-08-21 DIAGNOSIS — I10 Essential (primary) hypertension: Secondary | ICD-10-CM | POA: Diagnosis not present

## 2021-08-21 DIAGNOSIS — E782 Mixed hyperlipidemia: Secondary | ICD-10-CM | POA: Diagnosis not present

## 2021-09-02 DIAGNOSIS — R200 Anesthesia of skin: Secondary | ICD-10-CM | POA: Diagnosis not present

## 2021-12-18 DIAGNOSIS — L309 Dermatitis, unspecified: Secondary | ICD-10-CM | POA: Diagnosis not present

## 2022-01-06 DIAGNOSIS — H26493 Other secondary cataract, bilateral: Secondary | ICD-10-CM | POA: Diagnosis not present

## 2022-01-06 DIAGNOSIS — H1045 Other chronic allergic conjunctivitis: Secondary | ICD-10-CM | POA: Diagnosis not present

## 2022-01-06 DIAGNOSIS — H40013 Open angle with borderline findings, low risk, bilateral: Secondary | ICD-10-CM | POA: Diagnosis not present

## 2022-01-14 DIAGNOSIS — L710 Perioral dermatitis: Secondary | ICD-10-CM | POA: Diagnosis not present

## 2022-02-23 DIAGNOSIS — I10 Essential (primary) hypertension: Secondary | ICD-10-CM | POA: Diagnosis not present

## 2022-02-23 DIAGNOSIS — Z1331 Encounter for screening for depression: Secondary | ICD-10-CM | POA: Diagnosis not present

## 2022-02-23 DIAGNOSIS — E782 Mixed hyperlipidemia: Secondary | ICD-10-CM | POA: Diagnosis not present

## 2022-02-23 DIAGNOSIS — I493 Ventricular premature depolarization: Secondary | ICD-10-CM | POA: Diagnosis not present

## 2022-02-23 DIAGNOSIS — E039 Hypothyroidism, unspecified: Secondary | ICD-10-CM | POA: Diagnosis not present

## 2022-03-04 DIAGNOSIS — L299 Pruritus, unspecified: Secondary | ICD-10-CM | POA: Diagnosis not present

## 2022-03-04 DIAGNOSIS — L209 Atopic dermatitis, unspecified: Secondary | ICD-10-CM | POA: Diagnosis not present

## 2022-05-06 ENCOUNTER — Other Ambulatory Visit: Payer: Self-pay | Admitting: Family Medicine

## 2022-05-06 DIAGNOSIS — Z1231 Encounter for screening mammogram for malignant neoplasm of breast: Secondary | ICD-10-CM

## 2022-06-09 DIAGNOSIS — E039 Hypothyroidism, unspecified: Secondary | ICD-10-CM | POA: Diagnosis not present

## 2022-06-09 DIAGNOSIS — I10 Essential (primary) hypertension: Secondary | ICD-10-CM | POA: Diagnosis not present

## 2022-06-09 DIAGNOSIS — E782 Mixed hyperlipidemia: Secondary | ICD-10-CM | POA: Diagnosis not present

## 2022-06-09 DIAGNOSIS — I493 Ventricular premature depolarization: Secondary | ICD-10-CM | POA: Diagnosis not present

## 2022-06-09 DIAGNOSIS — Z139 Encounter for screening, unspecified: Secondary | ICD-10-CM | POA: Diagnosis not present

## 2022-06-19 DIAGNOSIS — W57XXXA Bitten or stung by nonvenomous insect and other nonvenomous arthropods, initial encounter: Secondary | ICD-10-CM | POA: Diagnosis not present

## 2022-06-19 DIAGNOSIS — Z91018 Allergy to other foods: Secondary | ICD-10-CM | POA: Diagnosis not present

## 2022-06-19 DIAGNOSIS — R5383 Other fatigue: Secondary | ICD-10-CM | POA: Diagnosis not present

## 2022-06-24 ENCOUNTER — Ambulatory Visit
Admission: RE | Admit: 2022-06-24 | Discharge: 2022-06-24 | Disposition: A | Discharge status: Home / Self Care | Payer: Medicare HMO | Source: Ambulatory Visit | Attending: Family Medicine | Admitting: Family Medicine

## 2022-06-24 DIAGNOSIS — Z1231 Encounter for screening mammogram for malignant neoplasm of breast: Secondary | ICD-10-CM | POA: Diagnosis not present

## 2022-08-13 DIAGNOSIS — L508 Other urticaria: Secondary | ICD-10-CM | POA: Diagnosis not present

## 2022-08-13 DIAGNOSIS — Z91018 Allergy to other foods: Secondary | ICD-10-CM | POA: Diagnosis not present

## 2022-08-13 DIAGNOSIS — J305 Allergic rhinitis due to food: Secondary | ICD-10-CM | POA: Diagnosis not present

## 2022-08-27 DIAGNOSIS — L501 Idiopathic urticaria: Secondary | ICD-10-CM | POA: Diagnosis not present

## 2022-08-27 DIAGNOSIS — L209 Atopic dermatitis, unspecified: Secondary | ICD-10-CM | POA: Diagnosis not present

## 2022-08-27 DIAGNOSIS — L299 Pruritus, unspecified: Secondary | ICD-10-CM | POA: Diagnosis not present

## 2022-12-14 DIAGNOSIS — J439 Emphysema, unspecified: Secondary | ICD-10-CM | POA: Diagnosis not present

## 2022-12-14 DIAGNOSIS — E559 Vitamin D deficiency, unspecified: Secondary | ICD-10-CM | POA: Diagnosis not present

## 2022-12-14 DIAGNOSIS — E039 Hypothyroidism, unspecified: Secondary | ICD-10-CM | POA: Diagnosis not present

## 2022-12-14 DIAGNOSIS — I2510 Atherosclerotic heart disease of native coronary artery without angina pectoris: Secondary | ICD-10-CM | POA: Diagnosis not present

## 2022-12-14 DIAGNOSIS — I700 Atherosclerosis of aorta: Secondary | ICD-10-CM | POA: Diagnosis not present

## 2022-12-14 DIAGNOSIS — Z9181 History of falling: Secondary | ICD-10-CM | POA: Diagnosis not present

## 2022-12-14 DIAGNOSIS — I10 Essential (primary) hypertension: Secondary | ICD-10-CM | POA: Diagnosis not present

## 2022-12-14 DIAGNOSIS — Z91018 Allergy to other foods: Secondary | ICD-10-CM | POA: Diagnosis not present

## 2022-12-14 DIAGNOSIS — E782 Mixed hyperlipidemia: Secondary | ICD-10-CM | POA: Diagnosis not present

## 2022-12-14 DIAGNOSIS — R7303 Prediabetes: Secondary | ICD-10-CM | POA: Diagnosis not present

## 2022-12-14 DIAGNOSIS — L659 Nonscarring hair loss, unspecified: Secondary | ICD-10-CM | POA: Diagnosis not present

## 2023-01-08 DIAGNOSIS — H40013 Open angle with borderline findings, low risk, bilateral: Secondary | ICD-10-CM | POA: Diagnosis not present

## 2023-01-08 DIAGNOSIS — H1045 Other chronic allergic conjunctivitis: Secondary | ICD-10-CM | POA: Diagnosis not present

## 2023-01-08 DIAGNOSIS — H26493 Other secondary cataract, bilateral: Secondary | ICD-10-CM | POA: Diagnosis not present

## 2023-02-02 DIAGNOSIS — M172 Bilateral post-traumatic osteoarthritis of knee: Secondary | ICD-10-CM | POA: Diagnosis not present

## 2023-02-02 DIAGNOSIS — G8929 Other chronic pain: Secondary | ICD-10-CM | POA: Diagnosis not present

## 2023-02-02 DIAGNOSIS — M25551 Pain in right hip: Secondary | ICD-10-CM | POA: Diagnosis not present

## 2023-05-12 ENCOUNTER — Other Ambulatory Visit: Payer: Self-pay | Admitting: Family Medicine

## 2023-05-12 DIAGNOSIS — Z1231 Encounter for screening mammogram for malignant neoplasm of breast: Secondary | ICD-10-CM

## 2023-06-25 ENCOUNTER — Ambulatory Visit
Admission: RE | Admit: 2023-06-25 | Discharge: 2023-06-25 | Disposition: A | Discharge status: Home / Self Care | Source: Ambulatory Visit | Attending: Family Medicine | Admitting: Family Medicine

## 2023-06-25 DIAGNOSIS — Z1231 Encounter for screening mammogram for malignant neoplasm of breast: Secondary | ICD-10-CM | POA: Diagnosis not present

## 2023-06-29 DIAGNOSIS — I10 Essential (primary) hypertension: Secondary | ICD-10-CM | POA: Diagnosis not present

## 2023-06-29 DIAGNOSIS — Z91018 Allergy to other foods: Secondary | ICD-10-CM | POA: Diagnosis not present

## 2023-06-29 DIAGNOSIS — E782 Mixed hyperlipidemia: Secondary | ICD-10-CM | POA: Diagnosis not present

## 2023-06-29 DIAGNOSIS — J439 Emphysema, unspecified: Secondary | ICD-10-CM | POA: Diagnosis not present

## 2023-06-29 DIAGNOSIS — Z139 Encounter for screening, unspecified: Secondary | ICD-10-CM | POA: Diagnosis not present

## 2023-06-29 DIAGNOSIS — Z1331 Encounter for screening for depression: Secondary | ICD-10-CM | POA: Diagnosis not present

## 2023-06-29 DIAGNOSIS — E039 Hypothyroidism, unspecified: Secondary | ICD-10-CM | POA: Diagnosis not present

## 2023-06-29 DIAGNOSIS — R7303 Prediabetes: Secondary | ICD-10-CM | POA: Diagnosis not present

## 2023-06-29 DIAGNOSIS — E559 Vitamin D deficiency, unspecified: Secondary | ICD-10-CM | POA: Diagnosis not present

## 2023-06-29 DIAGNOSIS — I2510 Atherosclerotic heart disease of native coronary artery without angina pectoris: Secondary | ICD-10-CM | POA: Diagnosis not present

## 2023-07-15 DIAGNOSIS — Z6835 Body mass index (BMI) 35.0-35.9, adult: Secondary | ICD-10-CM | POA: Diagnosis not present

## 2023-07-15 DIAGNOSIS — E66812 Obesity, class 2: Secondary | ICD-10-CM | POA: Diagnosis not present

## 2023-07-15 DIAGNOSIS — G8929 Other chronic pain: Secondary | ICD-10-CM | POA: Diagnosis not present

## 2023-07-15 DIAGNOSIS — M25561 Pain in right knee: Secondary | ICD-10-CM | POA: Diagnosis not present

## 2023-07-15 DIAGNOSIS — M25562 Pain in left knee: Secondary | ICD-10-CM | POA: Diagnosis not present

## 2023-09-08 DIAGNOSIS — Z008 Encounter for other general examination: Secondary | ICD-10-CM | POA: Diagnosis not present

## 2023-11-09 DIAGNOSIS — M172 Bilateral post-traumatic osteoarthritis of knee: Secondary | ICD-10-CM | POA: Diagnosis not present

## 2023-11-09 DIAGNOSIS — M25562 Pain in left knee: Secondary | ICD-10-CM | POA: Diagnosis not present

## 2023-11-09 DIAGNOSIS — M25561 Pain in right knee: Secondary | ICD-10-CM | POA: Diagnosis not present

## 2023-11-09 DIAGNOSIS — G8929 Other chronic pain: Secondary | ICD-10-CM | POA: Diagnosis not present

## 2024-01-03 DIAGNOSIS — I10 Essential (primary) hypertension: Secondary | ICD-10-CM | POA: Diagnosis not present

## 2024-01-03 DIAGNOSIS — Z91018 Allergy to other foods: Secondary | ICD-10-CM | POA: Diagnosis not present

## 2024-01-03 DIAGNOSIS — E039 Hypothyroidism, unspecified: Secondary | ICD-10-CM | POA: Diagnosis not present

## 2024-01-03 DIAGNOSIS — J439 Emphysema, unspecified: Secondary | ICD-10-CM | POA: Diagnosis not present

## 2024-01-03 DIAGNOSIS — R7303 Prediabetes: Secondary | ICD-10-CM | POA: Diagnosis not present

## 2024-01-03 DIAGNOSIS — I2510 Atherosclerotic heart disease of native coronary artery without angina pectoris: Secondary | ICD-10-CM | POA: Diagnosis not present

## 2024-01-03 DIAGNOSIS — E559 Vitamin D deficiency, unspecified: Secondary | ICD-10-CM | POA: Diagnosis not present

## 2024-01-03 DIAGNOSIS — Z9181 History of falling: Secondary | ICD-10-CM | POA: Diagnosis not present

## 2024-01-03 DIAGNOSIS — E782 Mixed hyperlipidemia: Secondary | ICD-10-CM | POA: Diagnosis not present

## 2024-01-11 DIAGNOSIS — H26493 Other secondary cataract, bilateral: Secondary | ICD-10-CM | POA: Diagnosis not present

## 2024-01-11 DIAGNOSIS — H1045 Other chronic allergic conjunctivitis: Secondary | ICD-10-CM | POA: Diagnosis not present

## 2024-01-11 DIAGNOSIS — H40013 Open angle with borderline findings, low risk, bilateral: Secondary | ICD-10-CM | POA: Diagnosis not present

## 2024-03-05 DIAGNOSIS — M1712 Unilateral primary osteoarthritis, left knee: Secondary | ICD-10-CM | POA: Insufficient documentation

## 2024-03-13 NOTE — Discharge Instructions (Signed)

## 2024-03-19 ENCOUNTER — Encounter: Payer: Self-pay | Admitting: Orthopedic Surgery

## 2024-03-19 DIAGNOSIS — Z90710 Acquired absence of both cervix and uterus: Secondary | ICD-10-CM | POA: Insufficient documentation

## 2024-03-19 NOTE — H&P (Signed)
 ORTHOPAEDIC HISTORY & PHYSICAL Jacqueline Herrera, Jacqueline Herrera., MD - 03/02/2024 11:00 AM EST Formatting of this note is different from the original. Images from the original note were not included. Chief Complaint: Chief Complaint Patient presents with Left Knee - Pain  Reason for Visit: The patient is a 74 y.o. female who presents today with her husband for reevaluation of her left knee. She has a long history of progressive left knee pain. She localizes most of the pain along the medial aspect of the knee. She reports some swelling, significant stiffness, no locking, and no giving way of the knee. The pain is aggravated by any weight bearing and going up and down stairs. The knee pain limits the patient's ability to ambulate long distances. The patient has not appreciated any significant improvement despite intraarticular corticosteroid injections and topical analgesics. She is not using any ambulatory aids. The patient states that the knee pain has progressed to the point that it is significantly interfering with her activities of daily living.  Medications: Current Outpatient Medications Medication Sig Dispense Refill aspirin 81 MG EC tablet Take 81 mg by mouth once daily. cholecalciferol (VITAMIN D3) 2,000 unit capsule Take 2,000 Units by mouth once daily levothyroxine (SYNTHROID, LEVOTHROID) 112 MCG tablet Take 112 mcg by mouth once daily. Take on an empty stomach with a glass of water at least 30-60 minutes before breakfast. metoprolol SUCCinate (TOPROL-XL) 50 MG XL tablet Take 50 mg by mouth once daily pravastatin (PRAVACHOL) 40 MG tablet Take 40 mg by mouth at bedtime  No current facility-administered medications for this visit.  Allergies: Allergies Allergen Reactions Codeine Sulfate Vomiting Codeine Nausea And Vomiting Tramadol Vomiting and Nausea And Vomiting Nausea.  Nausea. Nausea.  Past Medical History: Past Medical History: Diagnosis Date Allergic  state Hypercholesterolemia Hypertension Hypothyroidism Psoriasis  Past Surgical History: Past Surgical History: Procedure Laterality Date KNEE ARTHROSCOPY Left 09/16/2009 LEFT KNEE ARTHROSCOPY, PARTIAL LATERAL AND MEDIAL MENISECTOMIES AND CHONDROPLASTY OF THE LATERAL COMPARTMENT Right total hip arthroplasty 08/17/2012 Dr. Mardee COLONOSCOPY 02/28/2021 Sessile serrated polyp/Hyperplastic polyp/Repeat 85yrs/[facility] HYSTERECTOMY Wisdom teeth extracted  Social History: Social History  Socioeconomic History Marital status: Married Spouse name: Jacqueline Herrera Number of children: 3 Occupational History Occupation: Retired Tobacco Use Smoking status: Former Smokeless tobacco: Never Substance and Sexual Activity Alcohol use: No Alcohol/week: 0.0 standard drinks of alcohol Drug use: No Sexual activity: Defer  Social Drivers of Catering Manager Strain: Low Risk (03/02/2024) Overall Financial Resource Strain (CARDIA) Difficulty of Paying Living Expenses: Not very hard Food Insecurity: No Food Insecurity (03/02/2024) Hunger Vital Sign Worried About Running Out of Food in the Last Year: Never true Ran Out of Food in the Last Year: Never true Transportation Needs: No Transportation Needs (03/02/2024) PRAPARE - Contractor (Medical): No Lack of Transportation (Non-Medical): No Housing Stability: Low Risk (03/02/2024) Housing Stability Vital Sign Unable to Pay for Housing in the Last Year: No Number of Times Moved in the Last Year: 0 Homeless in the Last Year: No  Family History: Family History Problem Relation Name Age of Onset Lymphoma Mother Myocardial Infarction (Heart attack) Father Cancer Sister Cancer Sister Myocardial Infarction (Heart attack) Sister Heart disease Brother Kidney disease Brother Allergies Other Sibling  Review of Systems: A comprehensive 14 point ROS was performed, reviewed, and the pertinent orthopaedic findings are  documented in the HPI.  Exam BP 122/78 (BP Location: Left upper arm, Patient Position: Sitting, BP Cuff Size: Large Adult)  Ht 157.5 cm (5' 2.01)  Wt 88 kg (194 lb)  BMI 35.47 kg/m  General: Well-developed, well-nourished female seen in no acute distress. Antalgic gait. Varus thrust to the left knee.  HEENT: Atraumatic, normocephalic. Pupils are equal and reactive to light. Extraocular motion is intact. Sclera are clear. Oropharynx is clear with moist mucosa.  Neck: Supple, nontender, and with good ROM. No thyromegaly, adenopathy, JVD, or carotid bruits.  Lungs: Clear to auscultation bilaterally.  Cardiovascular: Regular rate and rhythm. Normal S1, S2. No murmur . No appreciable gallops or rubs. Peripheral pulses are palpable. No lower extremity edema. Homan`s test is negative.  Abdomen: Soft, nontender, nondistended. Bowel sounds are present.  Extremities: Good strength, stability, and range of motion of the upper extremities. Good range of motion of the hips and ankles.  Left Knee: Soft tissue swelling: mild Effusion: minimal Erythema: none Crepitance: mild Tenderness: medial Alignment: relative varus Mediolateral laxity: medial pseudolaxity Posterior sag: negative Patellar tracking: Good tracking without evidence of subluxation or tilt Atrophy: No significant atrophy. Quadriceps tone was fair to good. Range of motion: 0/0/90 degrees  Neurologic: Awake, alert, and oriented. Sensory function is intact to pinprick and light touch. Motor strength is judged to be 5/5. Motor coordination is within normal limits. No apparent clonus. No tremor.  X-rays: I ordered and interpreted standing AP, lateral, and sunrise radiographs of the left knee that were obtained in the office today. There is significant narrowing of the medial cartilage space with associated varus alignment. Osteophyte formation is noted. Subchondral sclerosis is noted. No evidence of fracture or  dislocation.  Impression: Degenerative arthrosis of the left knee  Plan: The findings were discussed in detail with the patient. The patient was given informational material on total knee replacement. Conservative treatment options were reviewed with the patient. We discussed the risks and benefits of surgical intervention. The usual perioperative course was also discussed in detail. The patient expressed understanding of the risks and benefits of surgical intervention and would like to proceed with plans for left total knee arthroplasty.  I spent a total of 45 minutes in both face-to-face and non-face-to-face activities, excluding procedures performed, for this visit on the date of this encounter.  MEDICAL CLEARANCE: Per anesthesiology. ACTIVITY: As tolerated. WORK STATUS: Not applicable. THERAPY: Preoperative physical therapy evaluation. MEDICATIONS: Requested Prescriptions  No prescriptions requested or ordered in this encounter  FOLLOW-UP: Return for preop History & Physical pending surgery date.  Dayton Sherr P. Adora Yeh, Jr., M.D.  This note was generated in part with voice recognition software and I apologize for any typographical errors that were not detected and corrected.  Electronically signed by Mardee Jacqueline Idalia Mickey., MD at 03/05/2024 6:00 PM EST

## 2024-03-22 NOTE — Patient Instructions (Signed)
 Your procedure is scheduled on:03-29-24 Wednesday Report to the Registration Desk on the 1st floor of the [facility].Then proceed to the 2nd floor Surgery Desk To find out your arrival time, please call [phone] between 1PM - 3PM on:03-28-24 Tuesday If your arrival time is 6:00 am, do not arrive before that time as the [facility] entrance doors do not open until 6:00 am.  REMEMBER: Instructions that are not followed completely may result in serious medical risk, up to and including death; or upon the discretion of your surgeon and anesthesiologist your surgery may need to be rescheduled.  Do not eat food after midnight the night before surgery.  No gum chewing or hard candies.  You may however, drink CLEAR liquids up to 2 hours before you are scheduled to arrive for your surgery. Do not drink anything within 2 hours of your scheduled arrival time.  Clear liquids include: - water  - apple juice without pulp - gatorade (not RED colors) - black coffee or tea (Do NOT add milk or creamers to the coffee or tea) Do NOT drink anything that is not on this list.  In addition, your doctor has ordered for you to drink the provided:  Ensure Pre-Surgery Clear Carbohydrate Drink  Drinking this carbohydrate drink up to two hours before surgery helps to reduce insulin resistance and improve patient outcomes. Please complete drinking 2 hours before scheduled arrival time.  One week prior to surgery:Stop NOW (03-22-24) Stop Anti-inflammatories (NSAIDS) such as Advil, Aleve, Ibuprofen, Motrin, Naproxen, Naprosyn and Aspirin based products such as Excedrin, Goody's Powder, BC Powder. Stop ANY OVER THE COUNTER supplements until after surgery (CoQ10, Vitamin D-K2)  You may however, continue to take Tylenol if needed for pain up until the day of surgery.  Continue taking all of your other prescription medications up until the day of surgery.  ON THE DAY OF SURGERY ONLY TAKE THESE MEDICATIONS WITH SIPS  OF WATER: -levothyroxine (SYNTHROID)  -metoprolol succinate (TOPROL-XL)   Continue your 81 mg Aspirin up until the day prior to surgery-Do NOT take the day of surgery  No Alcohol for 24 hours before or after surgery.  No Smoking including e-cigarettes for 24 hours before surgery.  No chewable tobacco products for at least 6 hours before surgery.  No nicotine patches on the day of surgery.  Do not use any recreational drugs for at least a week (preferably 2 weeks) before your surgery.  Please be advised that the combination of cocaine and anesthesia may have negative outcomes, up to and including death. If you test positive for cocaine, your surgery will be cancelled.  On the morning of surgery brush your teeth with toothpaste and water, you may rinse your mouth with mouthwash if you wish. Do not swallow any toothpaste or mouthwash.  Use CHG Soap as directed on instruction sheet.  Do not wear jewelry, make-up, hairpins, clips or nail polish.  For welded (permanent) jewelry: bracelets, anklets, waist bands, etc.  Please have this removed prior to surgery.  If it is not removed, there is a chance that hospital personnel will need to cut it off on the day of surgery.  Do not wear lotions, powders, or perfumes.   Do not shave body hair from the neck down 48 hours before surgery.  Contact lenses, hearing aids and dentures may not be worn into surgery.  Do not bring valuables to the hospital. [facility] is not responsible for any missing/lost belongings or valuables.   Notify your  doctor if there is any change in your medical condition (cold, fever, infection).  Wear comfortable clothing (specific to your surgery type) to the hospital.  After surgery, you can help prevent lung complications by doing breathing exercises.  Take deep breaths and cough every 1-2 hours. Your doctor may order a device called an Incentive Spirometer to help you take deep breaths. When coughing or  sneezing, hold a pillow firmly against your incision with both hands. This is called splinting. Doing this helps protect your incision. It also decreases belly discomfort.  If you are being admitted to the hospital overnight, leave your suitcase in the car. After surgery it may be brought to your room.  In case of increased patient census, it may be necessary for you, the patient, to continue your postoperative care in the Same Day Surgery department.  If you are being discharged the day of surgery, you will not be allowed to drive home. You will need a responsible individual to drive you home and stay with you for 24 hours after surgery.   If you are taking public transportation, you will need to have a responsible individual with you.  Please call the Pre-admissions Testing Dept. at [phone] if you have any questions about these instructions.  Surgery Visitation Policy:  Patients having surgery or a procedure may have two visitors.  Children under the age of 69 must have an adult with them who is not the patient.  Inpatient Visitation:    Visiting hours are 7 a.m. to 8 p.m. Up to four visitors are allowed at one time in a patient room. The visitors may rotate out with other people during the day.  One visitor age 39 or older may stay with the patient overnight and must be in the room by 8 p.m.    Pre-operative 4 CHG Bath Instructions   You can play a key role in reducing the risk of infection after surgery. Your skin needs to be as free of germs as possible. You can reduce the number of germs on your skin by washing with CHG (chlorhexidine gluconate) soap before surgery. CHG is an antiseptic soap that kills germs and continues to kill germs even after washing.   DO NOT use if you have an allergy to chlorhexidine/CHG or antibacterial soaps. If your skin becomes reddened or irritated, stop using the CHG and notify one of our RNs at [phone].   Please shower with the CHG  soap starting 4 days before surgery using the following schedule:     Please keep in mind the following:  DO NOT shave, including legs and underarms, starting the day of your first shower.   You may shave your face at any point before/day of surgery.  Place clean sheets on your bed the day you start using CHG soap. Use a clean washcloth (not used since being washed) for each shower. DO NOT sleep with pets once you start using the CHG.   CHG Shower Instructions:  If you choose to wash your hair and private area, wash first with your normal shampoo/soap.  After you use shampoo/soap, rinse your hair and body thoroughly to remove shampoo/soap residue.  Turn the water OFF and apply about 3 tablespoons (45 ml) of CHG soap to a CLEAN washcloth.  Apply CHG soap ONLY FROM YOUR NECK DOWN TO YOUR TOES (washing for 3-5 minutes)  DO NOT use CHG soap on face, private areas, open wounds, or sores.  Pay special attention to the area  where your surgery is being performed.  If you are having back surgery, having someone wash your back for you may be helpful. Wait 2 minutes after CHG soap is applied, then you may rinse off the CHG soap.  Pat dry with a clean towel  Put on clean clothes/pajamas   If you choose to wear lotion, please use ONLY the CHG-compatible lotions on the back of this paper.     Additional instructions for the day of surgery: DO NOT APPLY any lotions, deodorants, cologne, or perfumes.   Put on clean/comfortable clothes.  Brush your teeth.  Ask your nurse before applying any prescription medications to the skin.      CHG Compatible Lotions   Aveeno Moisturizing lotion  Cetaphil Moisturizing Cream  Cetaphil Moisturizing Lotion  Clairol Herbal Essence Moisturizing Lotion, Dry Skin  Clairol Herbal Essence Moisturizing Lotion, Extra Dry Skin  Clairol Herbal Essence Moisturizing Lotion, Normal Skin  Curel Age Defying Therapeutic Moisturizing Lotion with Alpha Hydroxy  Curel  Extreme Care Body Lotion  Curel Soothing Hands Moisturizing Hand Lotion  Curel Therapeutic Moisturizing Cream, Fragrance-Free  Curel Therapeutic Moisturizing Lotion, Fragrance-Free  Curel Therapeutic Moisturizing Lotion, Original Formula  Eucerin Daily Replenishing Lotion  Eucerin Dry Skin Therapy Plus Alpha Hydroxy Crme  Eucerin Dry Skin Therapy Plus Alpha Hydroxy Lotion  Eucerin Original Crme  Eucerin Original Lotion  Eucerin Plus Crme Eucerin Plus Lotion  Eucerin TriLipid Replenishing Lotion  Keri Anti-Bacterial Hand Lotion  Keri Deep Conditioning Original Lotion Dry Skin Formula Softly Scented  Keri Deep Conditioning Original Lotion, Fragrance Free Sensitive Skin Formula  Keri Lotion Fast Absorbing Fragrance Free Sensitive Skin Formula  Keri Lotion Fast Absorbing Softly Scented Dry Skin Formula  Keri Original Lotion  Keri Skin Renewal Lotion Keri Silky Smooth Lotion  Keri Silky Smooth Sensitive Skin Lotion  Nivea Body Creamy Conditioning Oil  Nivea Body Extra Enriched Lotion  Nivea Body Original Lotion  Nivea Body Sheer Moisturizing Lotion Nivea Crme  Nivea Skin Firming Lotion  NutraDerm 30 Skin Lotion  NutraDerm Skin Lotion  NutraDerm Therapeutic Skin Cream  NutraDerm Therapeutic Skin Lotion  ProShield Protective Hand Cream  Provon moisturizing lotion  How to Use an Incentive Spirometer An incentive spirometer is a tool that measures how well you are filling your lungs with each breath. Learning to take long, deep breaths using this tool can help you keep your lungs clear and active. This may help to reverse or lessen your chance of developing breathing (pulmonary) problems, especially infection. You may be asked to use a spirometer: After a surgery. If you have a lung problem or a history of smoking. After a long period of time when you have been unable to move or be active. If the spirometer includes an indicator to show the highest number that you have reached, your  health care provider or respiratory therapist will help you set a goal. Keep a log of your progress as told by your health care provider. What are the risks? Breathing too quickly may cause dizziness or cause you to pass out. Take your time so you do not get dizzy or light-headed. If you are in pain, you may need to take pain medicine before doing incentive spirometry. It is harder to take a deep breath if you are having pain. How to use your incentive spirometer  Sit up on the edge of your bed or on a chair. Hold the incentive spirometer so that it is in an upright position. Before you use the  spirometer, breathe out normally. Place the mouthpiece in your mouth. Make sure your lips are closed tightly around it. Breathe in slowly and as deeply as you can through your mouth, causing the piston or the ball to rise toward the top of the chamber. Hold your breath for 3-5 seconds, or for as long as possible. If the spirometer includes a coach indicator, use this to guide you in breathing. Slow down your breathing if the indicator goes above the marked areas. Remove the mouthpiece from your mouth and breathe out normally. The piston or ball will return to the bottom of the chamber. Rest for a few seconds, then repeat the steps 10 or more times. Take your time and take a few normal breaths between deep breaths so that you do not get dizzy or light-headed. Do this every 1-2 hours when you are awake. If the spirometer includes a goal marker to show the highest number you have reached (best effort), use this as a goal to work toward during each repetition. After each set of 10 deep breaths, cough a few times. This will help to make sure that your lungs are clear. If you have an incision on your chest or abdomen from surgery, place a pillow or a rolled-up towel firmly against the incision when you cough. This can help to reduce pain while taking deep breaths and coughing. General tips When you are able to  get out of bed: Walk around often. Continue to take deep breaths and cough in order to clear your lungs. Keep using the incentive spirometer until your health care provider says it is okay to stop using it. If you have been in the hospital, you may be told to keep using the spirometer at home. Contact a health care provider if: You are having difficulty using the spirometer. You have trouble using the spirometer as often as instructed. Your pain medicine is not giving enough relief for you to use the spirometer as told. You have a fever. Get help right away if: You develop shortness of breath. You develop a cough with bloody mucus from the lungs. You have fluid or blood coming from an incision site after you cough. Summary An incentive spirometer is a tool that can help you learn to take long, deep breaths to keep your lungs clear and active. You may be asked to use a spirometer after a surgery, if you have a lung problem or a history of smoking, or if you have been inactive for a long period of time. Use your incentive spirometer as instructed every 1-2 hours while you are awake. If you have an incision on your chest or abdomen, place a pillow or a rolled-up towel firmly against your incision when you cough. This will help to reduce pain. Get help right away if you have shortness of breath, you cough up bloody mucus, or blood comes from your incision when you cough. This information is not intended to replace advice given to you by your health care provider. Make sure you discuss any questions you have with your health care provider. Document Revised: 12/11/2022 Document Reviewed: 12/11/2022 Elsevier Patient Education  2024 [employer] Inc.    Preoperative Educational Videos for Total Hip, Knee and Shoulder Replacements  To better prepare for surgery, please view our videos that explain the physical activity and discharge planning required to have the best surgical recovery at [facility].  [email or web address]  Questions? Call [phone] or email [email or web address]  Merchandiser, Retail to address health-related social needs:  [email or web address]

## 2024-03-23 ENCOUNTER — Other Ambulatory Visit: Payer: Self-pay

## 2024-03-23 ENCOUNTER — Inpatient Hospital Stay
Admission: RE | Admit: 2024-03-23 | Discharge: 2024-03-23 | Disposition: A | Source: Ambulatory Visit | Attending: Orthopedic Surgery | Admitting: Orthopedic Surgery

## 2024-03-23 VITALS — BP 172/68 | HR 75 | Resp 14 | Ht 62.0 in | Wt 196.7 lb

## 2024-03-23 DIAGNOSIS — M1712 Unilateral primary osteoarthritis, left knee: Secondary | ICD-10-CM

## 2024-03-23 DIAGNOSIS — Z01818 Encounter for other preprocedural examination: Secondary | ICD-10-CM

## 2024-03-23 HISTORY — DX: Anemia, unspecified: D64.9

## 2024-03-23 HISTORY — DX: Personal history of nicotine dependence: Z87.891

## 2024-03-23 HISTORY — DX: Cardiac arrhythmia, unspecified: I49.9

## 2024-03-23 HISTORY — DX: Unilateral primary osteoarthritis, left knee: M17.12

## 2024-03-23 HISTORY — DX: Nausea with vomiting, unspecified: R11.2

## 2024-03-23 LAB — COMPREHENSIVE METABOLIC PANEL WITH GFR
ALT: 17 U/L (ref 0–44)
AST: 22 U/L (ref 15–41)
Albumin: 4.2 g/dL (ref 3.5–5.0)
Alkaline Phosphatase: 64 U/L (ref 38–126)
Anion gap: 12 (ref 5–15)
BUN: 21 mg/dL (ref 8–23)
CO2: 25 mmol/L (ref 22–32)
Calcium: 9.4 mg/dL (ref 8.9–10.3)
Chloride: 104 mmol/L (ref 98–111)
Creatinine, Ser: 0.82 mg/dL (ref 0.44–1.00)
GFR, Estimated: >60 mL/min
Glucose, Bld: 108 mg/dL — ABNORMAL HIGH (ref 70–99)
Potassium: 4.1 mmol/L (ref 3.5–5.1)
Sodium: 141 mmol/L (ref 135–145)
Total Bilirubin: 0.5 mg/dL (ref 0.0–1.2)
Total Protein: 7 g/dL (ref 6.5–8.1)

## 2024-03-23 LAB — CBC
HCT: 42.1 % (ref 36.0–46.0)
Hemoglobin: 13.9 g/dL (ref 12.0–15.0)
MCH: 28.3 pg (ref 26.0–34.0)
MCHC: 33 g/dL (ref 30.0–36.0)
MCV: 85.7 fL (ref 80.0–100.0)
Platelets: 228 10*3/uL (ref 150–400)
RBC: 4.91 MIL/uL (ref 3.87–5.11)
RDW: 12.3 % (ref 11.5–15.5)
WBC: 8 10*3/uL (ref 4.0–10.5)
nRBC: 0 % (ref 0.0–0.2)

## 2024-03-23 LAB — URINALYSIS, ROUTINE W REFLEX MICROSCOPIC
Bilirubin Urine: NEGATIVE
Glucose, UA: NEGATIVE mg/dL
Ketones, ur: NEGATIVE mg/dL
Nitrite: NEGATIVE
Protein, ur: NEGATIVE mg/dL
Specific Gravity, Urine: 1.009 (ref 1.005–1.030)
pH: 5 (ref 5.0–8.0)

## 2024-03-23 LAB — SURGICAL PCR SCREEN
MRSA, PCR: NEGATIVE
Staphylococcus aureus: NEGATIVE

## 2024-03-23 LAB — SEDIMENTATION RATE: Sed Rate: 15 mm/h (ref 0–30)

## 2024-03-23 LAB — C-REACTIVE PROTEIN: CRP: <0.5 mg/dL

## 2024-03-29 ENCOUNTER — Encounter: Payer: Self-pay | Admitting: Urgent Care

## 2024-03-29 ENCOUNTER — Ambulatory Visit: Admit: 2024-03-29 | Admitting: Orthopedic Surgery

## 2024-03-29 ENCOUNTER — Encounter: Admission: RE | Payer: Self-pay

## 2024-03-29 DIAGNOSIS — R8271 Bacteriuria: Secondary | ICD-10-CM

## 2024-03-29 DIAGNOSIS — R8290 Unspecified abnormal findings in urine: Secondary | ICD-10-CM

## 2024-03-29 DIAGNOSIS — M1712 Unilateral primary osteoarthritis, left knee: Secondary | ICD-10-CM

## 2024-03-29 DIAGNOSIS — Z01812 Encounter for preprocedural laboratory examination: Secondary | ICD-10-CM

## 2024-03-29 SURGERY — ARTHROPLASTY, KNEE, TOTAL, USING IMAGELESS COMPUTER-ASSISTED NAVIGATION
Anesthesia: Choice | Site: Knee | Laterality: Left
